# Patient Record
Sex: Female | Born: 1935 | Race: White | Hispanic: No | Smoking: Never smoker
Health system: Southern US, Community
[De-identification: ages and names within clinical notes are randomized; demographics above are authoritative.]

## PROBLEM LIST (undated history)

## (undated) DIAGNOSIS — E119 Type 2 diabetes mellitus without complications: Secondary | ICD-10-CM

## (undated) DIAGNOSIS — M1990 Unspecified osteoarthritis, unspecified site: Secondary | ICD-10-CM

## (undated) DIAGNOSIS — N390 Urinary tract infection, site not specified: Secondary | ICD-10-CM

## (undated) DIAGNOSIS — I10 Essential (primary) hypertension: Secondary | ICD-10-CM

## (undated) HISTORY — PX: CHOLECYSTECTOMY: SHX55

## (undated) HISTORY — PX: HERNIA REPAIR: SHX51

---

## 2016-06-10 DIAGNOSIS — N390 Urinary tract infection, site not specified: Secondary | ICD-10-CM

## 2016-06-10 HISTORY — DX: Urinary tract infection, site not specified: N39.0

## 2016-06-12 ENCOUNTER — Emergency Department (HOSPITAL_COMMUNITY): Payer: Medicare Other

## 2016-06-12 ENCOUNTER — Inpatient Hospital Stay (HOSPITAL_COMMUNITY)
Admission: EM | Admit: 2016-06-12 | Discharge: 2016-07-08 | DRG: 689 | Disposition: E | Payer: Medicare Other | Attending: Internal Medicine | Admitting: Internal Medicine

## 2016-06-12 ENCOUNTER — Inpatient Hospital Stay (HOSPITAL_COMMUNITY): Payer: Medicare Other

## 2016-06-12 ENCOUNTER — Encounter (HOSPITAL_COMMUNITY): Payer: Self-pay | Admitting: Certified Nurse Midwife

## 2016-06-12 DIAGNOSIS — J9602 Acute respiratory failure with hypercapnia: Secondary | ICD-10-CM | POA: Diagnosis not present

## 2016-06-12 DIAGNOSIS — K5641 Fecal impaction: Secondary | ICD-10-CM | POA: Diagnosis present

## 2016-06-12 DIAGNOSIS — Z9049 Acquired absence of other specified parts of digestive tract: Secondary | ICD-10-CM

## 2016-06-12 DIAGNOSIS — R680 Hypothermia, not associated with low environmental temperature: Secondary | ICD-10-CM | POA: Diagnosis present

## 2016-06-12 DIAGNOSIS — K430 Incisional hernia with obstruction, without gangrene: Secondary | ICD-10-CM | POA: Diagnosis present

## 2016-06-12 DIAGNOSIS — Z7189 Other specified counseling: Secondary | ICD-10-CM

## 2016-06-12 DIAGNOSIS — G9340 Encephalopathy, unspecified: Secondary | ICD-10-CM | POA: Diagnosis not present

## 2016-06-12 DIAGNOSIS — N390 Urinary tract infection, site not specified: Principal | ICD-10-CM | POA: Diagnosis present

## 2016-06-12 DIAGNOSIS — Z638 Other specified problems related to primary support group: Secondary | ICD-10-CM | POA: Diagnosis not present

## 2016-06-12 DIAGNOSIS — Z79899 Other long term (current) drug therapy: Secondary | ICD-10-CM | POA: Diagnosis not present

## 2016-06-12 DIAGNOSIS — R4189 Other symptoms and signs involving cognitive functions and awareness: Secondary | ICD-10-CM

## 2016-06-12 DIAGNOSIS — A419 Sepsis, unspecified organism: Secondary | ICD-10-CM | POA: Diagnosis not present

## 2016-06-12 DIAGNOSIS — Z66 Do not resuscitate: Secondary | ICD-10-CM | POA: Diagnosis present

## 2016-06-12 DIAGNOSIS — Z515 Encounter for palliative care: Secondary | ICD-10-CM | POA: Diagnosis not present

## 2016-06-12 DIAGNOSIS — E119 Type 2 diabetes mellitus without complications: Secondary | ICD-10-CM | POA: Diagnosis not present

## 2016-06-12 DIAGNOSIS — M1990 Unspecified osteoarthritis, unspecified site: Secondary | ICD-10-CM | POA: Diagnosis present

## 2016-06-12 DIAGNOSIS — R109 Unspecified abdominal pain: Secondary | ICD-10-CM

## 2016-06-12 DIAGNOSIS — K469 Unspecified abdominal hernia without obstruction or gangrene: Secondary | ICD-10-CM | POA: Diagnosis not present

## 2016-06-12 DIAGNOSIS — E8770 Fluid overload, unspecified: Secondary | ICD-10-CM | POA: Diagnosis not present

## 2016-06-12 HISTORY — DX: Type 2 diabetes mellitus without complications: E11.9

## 2016-06-12 HISTORY — DX: Essential (primary) hypertension: I10

## 2016-06-12 HISTORY — DX: Urinary tract infection, site not specified: N39.0

## 2016-06-12 HISTORY — DX: Unspecified osteoarthritis, unspecified site: M19.90

## 2016-06-12 LAB — CBC WITH DIFFERENTIAL/PLATELET
BASOS ABS: 0 10*3/uL (ref 0.0–0.1)
Basophils Relative: 0 %
EOS ABS: 0 10*3/uL (ref 0.0–0.7)
Eosinophils Relative: 0 %
HEMATOCRIT: 47 % — AB (ref 36.0–46.0)
HEMOGLOBIN: 16.4 g/dL — AB (ref 12.0–15.0)
LYMPHS PCT: 19 %
Lymphs Abs: 1.5 10*3/uL (ref 0.7–4.0)
MCH: 32.5 pg (ref 26.0–34.0)
MCHC: 34.9 g/dL (ref 30.0–36.0)
MCV: 93.1 fL (ref 78.0–100.0)
MONOS PCT: 8 %
Monocytes Absolute: 0.6 10*3/uL (ref 0.1–1.0)
NEUTROS PCT: 73 %
Neutro Abs: 5.7 10*3/uL (ref 1.7–7.7)
Platelets: 221 10*3/uL (ref 150–400)
RBC: 5.05 MIL/uL (ref 3.87–5.11)
RDW: 12.6 % (ref 11.5–15.5)
WBC: 7.8 10*3/uL (ref 4.0–10.5)

## 2016-06-12 LAB — URINALYSIS, ROUTINE W REFLEX MICROSCOPIC
BILIRUBIN URINE: NEGATIVE
GLUCOSE, UA: NEGATIVE mg/dL
Ketones, ur: NEGATIVE mg/dL
NITRITE: NEGATIVE
PH: 5 (ref 5.0–8.0)
Protein, ur: NEGATIVE mg/dL
SPECIFIC GRAVITY, URINE: 1.017 (ref 1.005–1.030)

## 2016-06-12 LAB — COMPREHENSIVE METABOLIC PANEL
ALBUMIN: 3.5 g/dL (ref 3.5–5.0)
ALK PHOS: 50 U/L (ref 38–126)
ALT: UNDETERMINED U/L (ref 14–54)
ANION GAP: 11 (ref 5–15)
AST: 18 U/L (ref 15–41)
BILIRUBIN TOTAL: UNDETERMINED mg/dL (ref 0.3–1.2)
BUN: 15 mg/dL (ref 6–20)
CALCIUM: 9.1 mg/dL (ref 8.9–10.3)
CO2: 34 mmol/L — ABNORMAL HIGH (ref 22–32)
Chloride: 93 mmol/L — ABNORMAL LOW (ref 101–111)
Creatinine, Ser: 0.42 mg/dL — ABNORMAL LOW (ref 0.44–1.00)
GFR calc non Af Amer: >60 mL/min (ref >60)
Glucose, Bld: 173 mg/dL — ABNORMAL HIGH (ref 65–99)
POTASSIUM: 4.3 mmol/L (ref 3.5–5.1)
Sodium: 138 mmol/L (ref 135–145)
TOTAL PROTEIN: 6.5 g/dL (ref 6.5–8.1)

## 2016-06-12 LAB — CBC
HEMATOCRIT: 45 % (ref 36.0–46.0)
Hemoglobin: 14.4 g/dL (ref 12.0–15.0)
MCH: 29.9 pg (ref 26.0–34.0)
MCHC: 32 g/dL (ref 30.0–36.0)
MCV: 93.6 fL (ref 78.0–100.0)
Platelets: 209 10*3/uL (ref 150–400)
RBC: 4.81 MIL/uL (ref 3.87–5.11)
RDW: 12.5 % (ref 11.5–15.5)
WBC: 8 10*3/uL (ref 4.0–10.5)

## 2016-06-12 LAB — GLUCOSE, CAPILLARY: Glucose-Capillary: 146 mg/dL — ABNORMAL HIGH (ref 65–99)

## 2016-06-12 LAB — LACTIC ACID, PLASMA: LACTIC ACID, VENOUS: 0.8 mmol/L (ref 0.5–1.9)

## 2016-06-12 LAB — CREATININE, SERUM
Creatinine, Ser: 0.39 mg/dL — ABNORMAL LOW (ref 0.44–1.00)
Creatinine, Ser: 0.38 mg/dL — ABNORMAL LOW (ref 0.44–1.00)
GFR calc Af Amer: >60 mL/min (ref >60)
GFR calc non Af Amer: >60 mL/min (ref >60)

## 2016-06-12 LAB — TROPONIN I: TROPONIN I: 0.03 ng/mL — AB (ref <0.03)

## 2016-06-12 LAB — I-STAT CG4 LACTIC ACID, ED
LACTIC ACID, VENOUS: 0.72 mmol/L (ref 0.5–1.9)
Lactic Acid, Venous: 1.57 mmol/L (ref 0.5–1.9)

## 2016-06-12 MED ORDER — DEXTROSE 5 % IV SOLN
1.0000 g | INTRAVENOUS | Status: DC
  Administered 2016-06-13 – 2016-06-15 (×3): 1 g via INTRAVENOUS
  Filled 2016-06-12 (×5): qty 10

## 2016-06-12 MED ORDER — IOPAMIDOL (ISOVUE-300) INJECTION 61%
INTRAVENOUS | Status: AC
  Administered 2016-06-12: 100 mL
  Filled 2016-06-12: qty 100

## 2016-06-12 MED ORDER — SODIUM CHLORIDE 0.9 % IV BOLUS (SEPSIS)
1000.0000 mL | Freq: Once | INTRAVENOUS | Status: AC
  Administered 2016-06-12: 1000 mL via INTRAVENOUS

## 2016-06-12 MED ORDER — SODIUM CHLORIDE 0.9% FLUSH
3.0000 mL | Freq: Two times a day (BID) | INTRAVENOUS | Status: DC
  Administered 2016-06-12 – 2016-06-13 (×2): 3 mL via INTRAVENOUS

## 2016-06-12 MED ORDER — HYDRALAZINE HCL 20 MG/ML IJ SOLN: 10.0000 mg | INTRAMUSCULAR | Status: DC | PRN

## 2016-06-12 MED ORDER — INSULIN ASPART 100 UNIT/ML ~~LOC~~ SOLN: 0.0000 [IU] | Freq: Every day | SUBCUTANEOUS | Status: DC

## 2016-06-12 MED ORDER — ACETAMINOPHEN 325 MG PO TABS
650.0000 mg | ORAL_TABLET | Freq: Four times a day (QID) | ORAL | Status: DC | PRN
  Administered 2016-06-14 – 2016-06-15 (×2): 650 mg via ORAL
  Filled 2016-06-12 (×2): qty 2

## 2016-06-12 MED ORDER — LISINOPRIL 5 MG PO TABS
5.0000 mg | ORAL_TABLET | Freq: Every day | ORAL | Status: DC
  Administered 2016-06-12 – 2016-06-15 (×4): 5 mg via ORAL
  Filled 2016-06-12 (×5): qty 1

## 2016-06-12 MED ORDER — ACETAMINOPHEN 650 MG RE SUPP
650.0000 mg | Freq: Four times a day (QID) | RECTAL | Status: DC | PRN
  Filled 2016-06-12: qty 1

## 2016-06-12 MED ORDER — DEXTROSE 5 % IV SOLN: 2.0000 g | Freq: Once | INTRAVENOUS | Status: DC

## 2016-06-12 MED ORDER — INSULIN ASPART 100 UNIT/ML ~~LOC~~ SOLN
0.0000 [IU] | Freq: Three times a day (TID) | SUBCUTANEOUS | Status: DC
  Administered 2016-06-14: 3 [IU] via SUBCUTANEOUS
  Administered 2016-06-14 (×2): 2 [IU] via SUBCUTANEOUS
  Administered 2016-06-15: 3 [IU] via SUBCUTANEOUS
  Administered 2016-06-15: 2 [IU] via SUBCUTANEOUS

## 2016-06-12 MED ORDER — SODIUM CHLORIDE 0.9 % IV SOLN
INTRAVENOUS | Status: DC
  Administered 2016-06-12 – 2016-06-15 (×3): via INTRAVENOUS

## 2016-06-12 MED ORDER — ENOXAPARIN SODIUM 40 MG/0.4ML ~~LOC~~ SOLN
40.0000 mg | SUBCUTANEOUS | Status: DC
  Administered 2016-06-12 – 2016-06-14 (×3): 40 mg via SUBCUTANEOUS
  Filled 2016-06-12 (×3): qty 0.4

## 2016-06-12 MED ORDER — DEXTROSE 5 % IV SOLN
1.0000 g | Freq: Once | INTRAVENOUS | Status: AC
  Administered 2016-06-12: 1 g via INTRAVENOUS
  Filled 2016-06-12: qty 10

## 2016-06-12 MED ORDER — MELATONIN 3 MG PO TABS
3.0000 mg | ORAL_TABLET | Freq: Every evening | ORAL | Status: DC | PRN
  Filled 2016-06-12: qty 1

## 2016-06-12 NOTE — ED Notes (Signed)
Delay in lab draw,   Pt enroute to xray. 

## 2016-06-12 NOTE — ED Notes (Signed)
O2 sat 91% on RA and pt states she is SOB. O2 applied at 2L.

## 2016-06-12 NOTE — ED Provider Notes (Signed)
[facility]-EM[facility]ENCY DEPT Provider Note   CSN: [id number] Arrival date & time: 06/29/2016  1323     History   Chief Complaint Chief Complaint  Patient presents with  . Weakness    HPI Lindsey Garza is a 81 y.o. female.  Pt presents to the ED with generalized weakness.  She has [place] to [place] family's home in [place] back in July.  [facility] is followed [facility].  Pt normally walks with her walker and gets around pretty well.  The pt has not been able to ambulate well for the past 2 days.  The pt's family said she's been urinating a lot.  Pt denies any pain.      Past Medical History:  Diagnosis Date  . Arthritis   . Diabetes mellitus without complication [facility](HCC)     Patient Active Problem List   Diagnosis Date Noted  . Diabetes mellitus (HCC) 07/01/2016  . Arthritis 07/07/2016  . Sepsis secondary to UTI (HCC) 07/04/2016  . Abdominal hernia 07/06/2016  . UTI (urinary tract infection) 07/01/2016    Past Surgical History:  Procedure Laterality Date  . CHOLECYSTECTOMY    . HERNIA REPAIR      OB History    No data available       Home Medications    Prior to Admission medications   Medication Sig Start Date End Date Taking? Authorizing Provider  Melatonin 2.5 MG CAPS Take 2.5 mg by mouth at bedtime.   Yes Historical Provider, MD  sertraline (ZOLOFT) 25 MG tablet Take 25 mg by mouth daily.   Yes Historical Provider, MD    Family History No family history on file.  Social History Social History  Substance Use Topics  . Smoking status: Never Smoker  . Smokeless tobacco: Never Used  . Alcohol use No     Allergies   Patient has no known allergies.   Review of Systems Review of Systems  Neurological: Positive for weakness.  All other systems reviewed and are negative.    Physical Exam Updated Vital Signs BP 127/71 (BP Location: Left Arm)   Pulse 73   Temp 97.7 F (36.5 C) (Oral)   Resp 17   Ht 5\' 2"  (1.575 m) Comment: per  pt  Wt 133 lb 6.1 oz (60.5 kg)   SpO2 96%   BMI 24.40 kg/m   Physical Exam  Constitutional: She is oriented to person, place, and time. She appears well-developed and well-nourished.  HENT:  Head: Normocephalic and atraumatic.  Right Ear: External ear normal.  Left Ear: External ear normal.  Nose: Nose normal.  Mouth/Throat: Oropharynx is clear and moist.  Eyes: Conjunctivae and EOM are normal. Pupils are equal, round, and reactive to light.  Neck: Normal range of motion. Neck supple.  Cardiovascular: Normal rate, regular rhythm, normal heart sounds and intact distal pulses.   Pulmonary/Chest: Effort normal and breath sounds normal.  Abdominal: Soft. Bowel sounds are normal.  Large ventral hernia (chronic)  Musculoskeletal: Normal range of motion.  Neurological: She is alert and oriented to person, place, and time.  Skin:  Bilateral lower extremity chronic skin changes.  Psychiatric: She has a normal mood and affect. Her behavior is normal. Judgment and thought content normal.  Nursing note and vitals reviewed.    ED Treatments / Results  Labs (all labs ordered are listed, but only abnormal results are displayed) Labs Reviewed  COMPREHENSIVE METABOLIC PANEL - Abnormal; Notable for the following:  Result Value   Chloride 93 (*)    CO2 34 (*)    Glucose, Bld 173 (*)    Creatinine, Ser 0.42 (*)    All other components within normal limits  CBC WITH DIFFERENTIAL/PLATELET - Abnormal; Notable for the following:    Hemoglobin 16.4 (*)    HCT 47.0 (*)    All other components within normal limits  TROPONIN I - Abnormal; Notable for the following:    Troponin I 0.03 (*)    All other components within normal limits  URINALYSIS, ROUTINE W REFLEX MICROSCOPIC - Abnormal; Notable for the following:    Color, Urine AMBER (*)    APPearance CLOUDY (*)    Hgb urine dipstick MODERATE (*)    Leukocytes, UA LARGE (*)    Bacteria, UA MANY (*)    Squamous Epithelial / LPF 0-5 (*)      All other components within normal limits  CREATININE, SERUM - Abnormal; Notable for the following:    Creatinine, Ser 0.38 (*)    All other components within normal limits  COMPREHENSIVE METABOLIC PANEL - Abnormal; Notable for the following:    Chloride 95 (*)    CO2 38 (*)    Glucose, Bld 173 (*)    Creatinine, Ser 0.39 (*)    Total Protein 6.2 (*)    Albumin 3.2 (*)    All other components within normal limits  CBC - Abnormal; Notable for the following:    HCT 47.2 (*)    All other components within normal limits  HEMOGLOBIN A1C - Abnormal; Notable for the following:    Hgb A1c MFr Bld 7.9 (*)    All other components within normal limits  CREATININE, SERUM - Abnormal; Notable for the following:    Creatinine, Ser 0.39 (*)    All other components within normal limits  GLUCOSE, CAPILLARY - Abnormal; Notable for the following:    Glucose-Capillary 146 (*)    All other components within normal limits  GLUCOSE, CAPILLARY - Abnormal; Notable for the following:    Glucose-Capillary 129 (*)    All other components within normal limits  GLUCOSE, CAPILLARY - Abnormal; Notable for the following:    Glucose-Capillary 139 (*)    All other components within normal limits  BASIC METABOLIC PANEL - Abnormal; Notable for the following:    Chloride 88 (*)    CO2 39 (*)    Glucose, Bld 149 (*)    Creatinine, Ser 0.37 (*)    Calcium 8.5 (*)    All other components within normal limits  GLUCOSE, CAPILLARY - Abnormal; Notable for the following:    Glucose-Capillary 156 (*)    All other components within normal limits  GLUCOSE, CAPILLARY - Abnormal; Notable for the following:    Glucose-Capillary 174 (*)    All other components within normal limits  URINE CULTURE  CULTURE, BLOOD (ROUTINE X 2)  CULTURE, BLOOD (ROUTINE X 2)  LACTIC ACID, PLASMA  CBC  LACTIC ACID, PLASMA  CBC  I-STAT CG4 LACTIC ACID, ED  I-STAT CG4 LACTIC ACID, ED  CBG MONITORING, ED    EKG  EKG  Interpretation  Date/Time:  Saturday 06/25/2016 13:32:52 EST Ventricular Rate:  70 PR Interval:    QRS Duration: 88 QT Interval:  402 QTC Calculation: 434 R Axis:   26 Text Interpretation:  Sinus rhythm Ventricular premature complex Low voltage, precordial leads Probable anteroseptal infarct, old Confirmed by [facility] MD, Sou Nohr ([id number]) on 06-25-2016 1:35:05 PM  Radiology Dg Chest 2 View  Result Date: 2016-06-30 CLINICAL DATA:  Weakness and SOB X 2 days. Patient family reports patient has a knot on her right side on the lower part of her chest. HX diabetes EXAM: CHEST  2 VIEW COMPARISON:  None. FINDINGS: Heart size is normal. Mildly prominent interstitial marking are present. No focal consolidations or pleural effusions. No pulmonary edema. IMPRESSION: No evidence for acute cardiopulmonary abnormality. Electronically Signed   By: Norva Pavlov M.D.   On: 2016/06/30 15:15   Ct Abdomen Pelvis W Contrast  Result Date: Jun 30, 2016 CLINICAL DATA:  Worsening generalized weakness and abdominal pain. EXAM: CT ABDOMEN AND PELVIS WITH CONTRAST TECHNIQUE: Multidetector CT imaging of the abdomen and pelvis was performed using the standard protocol following bolus administration of intravenous contrast. CONTRAST:  ISOVUE-300 IOPAMIDOL (ISOVUE-300) INJECTION 61% COMPARISON:  None. FINDINGS: Lower chest: Mild airspace opacities in both lung bases could be atelectatic or infectious. No effusions. Hepatobiliary: No focal liver abnormality is seen. Status post cholecystectomy. No biliary dilatation. Pancreas: Unremarkable. No pancreatic ductal dilatation or surrounding inflammatory changes. Spleen: Normal in size without focal abnormality. Adrenals/Urinary Tract: Both adrenals are normal. A few low-attenuation lesions are present about each kidney, measuring up to 3 cm. These likely represent simple cysts. No suspicious renal parenchymal lesion is evident. Collecting systems and ureters are  unremarkable. Urinary bladder is displaced anteriorly by the distended rectum but otherwise unremarkable. Stomach/Bowel: There is marked rectal distention with stool, measuring up to 10 cm transverse. Stomach is unremarkable. Most of the small bowel and colon reside within a very large right lateral abdominal hernia. No evidence of obstruction within the hernia. No extraluminal gas. No focal inflammation of bowel. Vascular/Lymphatic: The abdominal aorta is normal in caliber with mild atherosclerotic calcification. No adenopathy in the abdomen or pelvis. Reproductive: Multiple uterine calcifications.  No adnexal masses. Other: No ascites. Musculoskeletal: No significant skeletal lesion. Moderately severe facet arthritis from L4 through S1. Very large right lateral abdominal hernia. Adjacent smaller anterolateral hernia. IMPRESSION: 1. Most of the small bowel and colon reside within a large right lateral abdominal wall hernia. Smaller adjacent right anterolateral hernia contains a portion of the cecum and ileocecal valve. No obstruction, perforation or acute inflammation of bowel within the hernia. 2. Marked rectal distention with stool, likely fecal impaction. The distended rectum displaces the urinary bladder anteriorly. 3. Mild lung base opacities bilaterally, atelectasis versus infection. 4. Low-attenuation renal lesions, probably benign cysts. 5. Severe lower lumbar facet arthritis. Electronically Signed   By: Ellery Plunk M.D.   On: 06-30-16 20:35    Procedures Procedures (including critical care time)  Medications Ordered in ED Medications  enoxaparin (LOVENOX) injection 40 mg (40 mg Subcutaneous Given 06/13/16 2304)  sodium chloride flush (NS) 0.9 % injection 3 mL (3 mLs Intravenous Not Given 06/13/16 2200)  0.9 %  sodium chloride infusion ( Intravenous New Bag/Given 06/14/16 0048)  acetaminophen (TYLENOL) tablet 650 mg (not administered)    Or  acetaminophen (TYLENOL) suppository 650 mg (not  administered)  insulin aspart (novoLOG) injection 0-15 Units ( Subcutaneous Not Given 06/13/16 1855)  insulin aspart (novoLOG) injection 0-5 Units (0 Units Subcutaneous Not Given 06/13/16 2200)  lisinopril (PRINIVIL,ZESTRIL) tablet 5 mg (5 mg Oral Given 06/13/16 1202)  hydrALAZINE (APRESOLINE) injection 10 mg (not administered)  cefTRIAXone (ROCEPHIN) 1 g in dextrose 5 % 50 mL IVPB (1 g Intravenous Given 06/13/16 1855)  Melatonin TABS 3 mg (not administered)  sertraline (ZOLOFT) tablet 25 mg (25 mg Oral  Given 06/13/16 1202)  morphine 2 MG/ML injection 0.5 mg (not administered)  sodium chloride 0.9 % bolus 1,000 mL (0 mLs Intravenous Stopped 2016/07/01 1620)  cefTRIAXone (ROCEPHIN) 1 g in dextrose 5 % 50 mL IVPB (0 g Intravenous Stopped 07-01-16 2001)  iopamidol (ISOVUE-300) 61 % injection (100 mLs  Contrast Given 2016/07/01 1941)  magnesium citrate solution 1 Bottle (1 Bottle Oral Given 06/13/16 0837)  polyethylene glycol (MIRALAX / GLYCOLAX) packet 17 g (17 g Oral Given 06/13/16 0837)  sorbitol, milk of mag, mineral oil, glycerin (SMOG) enema (960 mLs Rectal Given 06/13/16 1338)     Initial Impression / Assessment and Plan / ED Course  I have reviewed the triage vital signs and the nursing notes.  Pertinent labs & imaging results that were available during my care of the patient were reviewed by me and considered in my medical decision making (see chart for details).    Labs are pending at shift change.  Pt to be signed out to Dr. Madilyn Hook.  Final Clinical Impressions(s) / ED Diagnoses   Final diagnoses:  Abdominal hernia  Abdominal hernia without obstruction and without gangrene, recurrence not specified, unspecified hernia type    New Prescriptions Current Discharge Medication List       Jacalyn Lefevre, MD 06/14/16 [phone]

## 2016-06-12 NOTE — ED Triage Notes (Signed)
Pt arrives [facility] for generalized weakness. There family states that pt is usually able to ambulate with walker but has been unable to do that for last 2 days. Family at the bedside.

## 2016-06-12 NOTE — ED Provider Notes (Signed)
Patient visit shared. Patient here with increased weakness.  She is weak, confused and intermittently tachypneic on exam.  UA c/w UTI, treating with abx.  Hospitalist consulted for admission for further treatment.  Patient and family updated of findings and in agreement with admission.     Tilden FossaElizabeth Erielle Gawronski, MD 06/13/16 [phone]

## 2016-06-12 NOTE — H&P (Signed)
History and Physical    Lindsey Garza RUE:[id number] DOB: [birth year] DOA: June 24, 2016  PCP: PROVIDER NOT IN SYSTEM  Patient coming from: Home  Chief Complaint: Weakness  HPI: Lindsey Garza is a 81 y.o. female with medical history significant of DM, arthritis, abd hernia who presents with worsening generalized weakness and polyuria over the past  2-3 days prior to hospital admission. Patient lives with her son, who later brought pt to the ED as weakness continued to worsen. Patient does report some generalized abd pain. Reports some sob. No coughing, but does report some runny nose. Patient reports last UTI was "many years ago." Is managing DM through diet primarily. Patient also reports soft mass over RUQ that has been present over the past several months. Initially denied tenderness, however pt noted to grimace on palpation over lesion in ED.  ED Course: In the ED, pt was found to have an axillary temp of 63F, tachypenea, and UA suggestive of UTI. Urine cx was ordered. CXR was unremarkable. Patient was started on empiric rocephin and hospitalist service consulted for consideration for admission.  Review of Systems:  Review of Systems  Constitutional: Positive for malaise/fatigue.  HENT: Negative for ear discharge, ear pain and nosebleeds.   Eyes: Negative for double vision and photophobia.  Respiratory: Positive for shortness of breath. Negative for cough, hemoptysis, wheezing and stridor.   Cardiovascular: Negative for orthopnea and claudication.  Gastrointestinal: Negative for blood in stool, diarrhea and heartburn.  Genitourinary: Positive for frequency. Negative for hematuria.  Musculoskeletal: Positive for falls and joint pain. Negative for myalgias.  Neurological: Positive for weakness. Negative for tingling, tremors and loss of consciousness.  Psychiatric/Behavioral: Negative for hallucinations and memory loss. The patient is not nervous/anxious.     Past Medical History:    Diagnosis Date  . Arthritis   . Diabetes mellitus without complication [facility])     Past Surgical History:  Procedure Laterality Date  . CHOLECYSTECTOMY    . HERNIA REPAIR       reports that she has never smoked. She has never used smokeless tobacco. She reports that she does not drink alcohol. Her drug history is not on file.  No Known Allergies  No family history on file.  Prior to Admission medications   Medication Sig Start Date End Date Taking? Authorizing Provider  Melatonin 2.5 MG CAPS Take 2.5 mg by mouth at bedtime.   Yes Historical Provider, MD  sertraline (ZOLOFT) 25 MG tablet Take 25 mg by mouth daily.   Yes Historical Provider, MD    Physical Exam: Vitals:   Jun 24, 2016 1700 06-24-2016 1715 06-24-2016 1730 06/24/2016 1745  BP: 157/96 176/93 (!) 149/103 155/100  Pulse: 80 75 64 (!) 28  Resp: 17 23 17  (!) 27  Temp:      TempSrc:      SpO2: 92% 92% 100% 93%  Weight:      Height:        Constitutional: NAD, calm, comfortable Vitals:   06-24-2016 1700 24-Jun-2016 1715 06/24/16 1730 06-24-2016 1745  BP: 157/96 176/93 (!) 149/103 155/100  Pulse: 80 75 64 (!) 28  Resp: 17 23 17  (!) 27  Temp:      TempSrc:      SpO2: 92% 92% 100% 93%  Weight:      Height:       Eyes: PERRL, lids and conjunctivae normal ENMT: Mucous membranes are moist. Posterior pharynx clear of any exudate or lesions.Normal dentition.  Neck: normal, supple, no masses, no thyromegaly Respiratory:  clear to auscultation bilaterally, no wheezing, no crackles. Normal respiratory effort. No accessory muscle use.  Cardiovascular: Regular rate and rhythm, no murmurs / rubs / gallops. No extremity edema. 2+ pedal pulses. No carotid bruits.  Abdomen: pos BS, R lower quadrant abd hernia, soft palpable mass over RUQ with grimacing on palpation Musculoskeletal: no clubbing / cyanosis. No joint deformity upper and lower extremities. Good ROM, no contractures. Normal muscle tone.  Skin: no rashes, lesions, ulcers. No  induration Neurologic: CN 2-12 grossly intact. Sensation intact, DTR normal. Strength 5/5 in all 4.  Psychiatric: Normal judgment and insight. Alert and oriented x 3. Normal mood.    Labs on Admission: I have personally reviewed following labs and imaging studies  CBC:  Recent Labs Lab Jun 05, 2016 1623  WBC 7.8  NEUTROABS 5.7  HGB 16.4*  HCT 47.0*  MCV 93.1  PLT 221   Basic Metabolic Panel:  Recent Labs Lab Jun 05, 2016 1623  NA 138  K 4.3  CL 93*  CO2 34*  GLUCOSE 173*  BUN 15  CREATININE 0.42*  CALCIUM 9.1   GFR: Estimated Creatinine Clearance: 50 mL/min (by C-G formula based on SCr of 0.42 mg/dL (L)). Liver Function Tests:  Recent Labs Lab Jun 05, 2016 1623  AST 18  ALT QUANTITY NOT SUFFICIENT, UNABLE TO PERFORM TEST  ALKPHOS 50  BILITOT QUANTITY NOT SUFFICIENT, UNABLE TO PERFORM TEST  PROT 6.5  ALBUMIN 3.5   No results for input(s): LIPASE, AMYLASE in the last 168 hours. No results for input(s): AMMONIA in the last 168 hours. Coagulation Profile: No results for input(s): INR, PROTIME in the last 168 hours. Cardiac Enzymes:  Recent Labs Lab Jun 05, 2016 1623  TROPONINI 0.03*   BNP (last 3 results) No results for input(s): PROBNP in the last 8760 hours. HbA1C: No results for input(s): HGBA1C in the last 72 hours. CBG: No results for input(s): GLUCAP in the last 168 hours. Lipid Profile: No results for input(s): CHOL, HDL, LDLCALC, TRIG, CHOLHDL, LDLDIRECT in the last 72 hours. Thyroid Function Tests: No results for input(s): TSH, T4TOTAL, FREET4, T3FREE, THYROIDAB in the last 72 hours. Anemia Panel: No results for input(s): VITAMINB12, FOLATE, FERRITIN, TIBC, IRON, RETICCTPCT in the last 72 hours. Urine analysis:    Component Value Date/Time   COLORURINE AMBER (A) 2017/04/10 1549   APPEARANCEUR CLOUDY (A) 2017/04/10 1549   LABSPEC 1.017 2017/04/10 1549   PHURINE 5.0 2017/04/10 1549   GLUCOSEU NEGATIVE 2017/04/10 1549   HGBUR MODERATE (A) 2017/04/10  1549   BILIRUBINUR NEGATIVE 2017/04/10 1549   KETONESUR NEGATIVE 2017/04/10 1549   PROTEINUR NEGATIVE 2017/04/10 1549   NITRITE NEGATIVE 2017/04/10 1549   LEUKOCYTESUR LARGE (A) 2017/04/10 1549   Sepsis Labs: !!!!!!!!!!!!!!!!!!!!!!!!!!!!!!!!!!!!!!!!!!!! @LABRCNTIP (procalcitonin:4,lacticidven:4) )No results found for this or any previous visit (from the past 240 hour(s)).   Radiological Exams on Admission: Dg Chest 2 View  Result Date: 06/13/2016 CLINICAL DATA:  Weakness and SOB X 2 days. Patient family reports patient has a knot on her right side on the lower part of her chest. HX diabetes EXAM: CHEST  2 VIEW COMPARISON:  None. FINDINGS: Heart size is normal. Mildly prominent interstitial marking are present. No focal consolidations or pleural effusions. No pulmonary edema. IMPRESSION: No evidence for acute cardiopulmonary abnormality. Electronically Signed   By: Norva PavlovElizabeth  Brown M.D.   On: 2017/04/10 15:15    Assessment/Plan Principal Problem:   Sepsis secondary to UTI [facility](HCC) Active Problems:   Diabetes mellitus (HCC)   Arthritis   Abdominal hernia   UTI (urinary  tract infection)   1. UTI with possible sepsis present on admission 1. Hypothermia, tachypnea with evidence of UTI on presentation 2. UA suggestive of UTI 3. Urine cx pending 4. Will order blood cx 5. Agree with rocephin empirically. Will continue 6. Cont on basal IVF at 75cc/hr 7. Lactate <2 at presentation. Will follow trends 8. Admit to med-tele, inpt 2. Diabetes mellitus 1. Will continue on SSI coverage 2. Random glucose of 173 3. Will check a1c 3. Arthritis 1. Appear stable at this time 2. Will request PT/OT 4. Abdominal hernia 1. No signs of incarceration of lower quadrant hernia 2. Palpable RUQ soft mass noted on exam with grimacing on palpation 3. Family reports no recent abdominal imaging 4. Will request CT abd/pelvis to r/o acute process  DVT prophylaxis: Lovenox subQ  Code Status: Full Family  Communication: Pt in room  Disposition Plan: Uncertain at this time  Consults called:  Admission status: Inpt as patient will likely need more than 2 midnight stay to medically treat sepsis with UTI   Lummie Montijo, Scheryl Marten MD [facility] Pager [phone]  If 7PM-7AM, please contact night-coverage [email or web address] Password [facility]  06/22/2016, 6:39 PM

## 2016-06-12 NOTE — Progress Notes (Signed)
Pharmacy Antibiotic Note  Lindsey Garza is a 81 y.o. female admitted on 06/25/2016 with sepsis 2/2 to UTI.  Pharmacy has been consulted for Ceftriaxone dosing for UTI  Plan: Ceftriaxone 1gm IV q24h  Pharmacy will sign off. No adjustments needed if renal function changes.   Height: 5\' 4"  (162.6 cm) Weight: 140 lb (63.5 kg) IBW/kg (Calculated) : 54.7  Temp (24hrs), Avg:95.8 F (35.4 C), Min:95.8 F (35.4 C), Max:95.8 F (35.4 C)   Recent Labs Lab 06/26/2016 1616 06/25/2016 1623 06/25/2016 1746  WBC  --  7.8  --   CREATININE  --  0.42* 0.39*  LATICACIDVEN 1.57  --   --     Estimated Creatinine Clearance: 50 mL/min (by C-G formula based on SCr of 0.39 mg/dL (L)).    No Known Allergies   Thank you for allowing pharmacy to be a part of this patient's care.  Lindsey Garza, RPh Clinical Pharmacist Pager: [phone] 06/10/2016 7:14 PM

## 2016-06-12 NOTE — ED Notes (Signed)
Attempted to call report x 1  

## 2016-06-13 DIAGNOSIS — N390 Urinary tract infection, site not specified: Principal | ICD-10-CM

## 2016-06-13 DIAGNOSIS — A419 Sepsis, unspecified organism: Secondary | ICD-10-CM

## 2016-06-13 DIAGNOSIS — E119 Type 2 diabetes mellitus without complications: Secondary | ICD-10-CM

## 2016-06-13 DIAGNOSIS — M1990 Unspecified osteoarthritis, unspecified site: Secondary | ICD-10-CM

## 2016-06-13 DIAGNOSIS — K469 Unspecified abdominal hernia without obstruction or gangrene: Secondary | ICD-10-CM

## 2016-06-13 LAB — CBC
HCT: 47.2 % — ABNORMAL HIGH (ref 36.0–46.0)
Hemoglobin: 14.9 g/dL (ref 12.0–15.0)
MCH: 30.1 pg (ref 26.0–34.0)
MCHC: 31.6 g/dL (ref 30.0–36.0)
MCV: 95.4 fL (ref 78.0–100.0)
PLATELETS: 183 10*3/uL (ref 150–400)
RBC: 4.95 MIL/uL (ref 3.87–5.11)
RDW: 12.6 % (ref 11.5–15.5)
WBC: 9 10*3/uL (ref 4.0–10.5)

## 2016-06-13 LAB — LACTIC ACID, PLASMA: LACTIC ACID, VENOUS: 1.1 mmol/L (ref 0.5–1.9)

## 2016-06-13 LAB — COMPREHENSIVE METABOLIC PANEL
ALK PHOS: 48 U/L (ref 38–126)
ALT: 15 U/L (ref 14–54)
AST: 17 U/L (ref 15–41)
Albumin: 3.2 g/dL — ABNORMAL LOW (ref 3.5–5.0)
Anion gap: 6 (ref 5–15)
BUN: 12 mg/dL (ref 6–20)
CHLORIDE: 95 mmol/L — AB (ref 101–111)
CO2: 38 mmol/L — AB (ref 22–32)
CREATININE: 0.39 mg/dL — AB (ref 0.44–1.00)
Calcium: 8.9 mg/dL (ref 8.9–10.3)
GFR calc Af Amer: >60 mL/min (ref >60)
GFR calc non Af Amer: >60 mL/min (ref >60)
GLUCOSE: 173 mg/dL — AB (ref 65–99)
Potassium: 4 mmol/L (ref 3.5–5.1)
SODIUM: 139 mmol/L (ref 135–145)
Total Bilirubin: 0.4 mg/dL (ref 0.3–1.2)
Total Protein: 6.2 g/dL — ABNORMAL LOW (ref 6.5–8.1)

## 2016-06-13 LAB — GLUCOSE, CAPILLARY
GLUCOSE-CAPILLARY: 129 mg/dL — AB (ref 65–99)
GLUCOSE-CAPILLARY: 174 mg/dL — AB (ref 65–99)
Glucose-Capillary: 139 mg/dL — ABNORMAL HIGH (ref 65–99)
Glucose-Capillary: 156 mg/dL — ABNORMAL HIGH (ref 65–99)

## 2016-06-13 LAB — HEMOGLOBIN A1C
Hgb A1c MFr Bld: 7.9 % — ABNORMAL HIGH (ref 4.8–5.6)
Mean Plasma Glucose: 180 mg/dL

## 2016-06-13 MED ORDER — SENNOSIDES-DOCUSATE SODIUM 8.6-50 MG PO TABS: 1.0000 | ORAL_TABLET | Freq: Two times a day (BID) | ORAL | Status: DC

## 2016-06-13 MED ORDER — SERTRALINE HCL 25 MG PO TABS
25.0000 mg | ORAL_TABLET | Freq: Every day | ORAL | Status: DC
  Administered 2016-06-13 – 2016-06-15 (×3): 25 mg via ORAL
  Filled 2016-06-13 (×4): qty 1

## 2016-06-13 MED ORDER — MAGNESIUM CITRATE PO SOLN
1.0000 | Freq: Once | ORAL | Status: AC
  Administered 2016-06-13: 1 via ORAL
  Filled 2016-06-13: qty 296

## 2016-06-13 MED ORDER — SORBITOL 70 % SOLN
960.0000 mL | TOPICAL_OIL | Freq: Once | ORAL | Status: AC
  Administered 2016-06-13: 960 mL via RECTAL
  Filled 2016-06-13: qty 240

## 2016-06-13 MED ORDER — POLYETHYLENE GLYCOL 3350 17 G PO PACK
17.0000 g | PACK | Freq: Once | ORAL | Status: AC
  Administered 2016-06-13: 17 g via ORAL
  Filled 2016-06-13: qty 1

## 2016-06-13 MED ORDER — MORPHINE SULFATE (PF) 2 MG/ML IV SOLN: 0.5000 mg | INTRAVENOUS | Status: DC | PRN

## 2016-06-13 NOTE — Consult Note (Signed)
Reason for Consult:hernia Referring Physician: Dr. Delfino Lovett is an 81 y.o. female.  HPI: This is a 81 year old female who I have been asked to evaluate for incisional hernia. The patient was actually admitted with generalized weakness and polyuria and found to have a UTI with possible sepsis. She had undergone a CT scan of her abdomen and pelvis showing a very large hernia. She has no obstructive symptoms. The patient had a previous open cholecystectomy many years ago and then developed a large ventral hernia. She reports it was repaired in [place] at least 15 years ago.  She reports having had the recurrent hernia for many years. She has no pain from the hernia. She has been slightly constipated.  Past Medical History:  Diagnosis Date  . Arthritis   . Diabetes mellitus without complication [facility])     Past Surgical History:  Procedure Laterality Date  . CHOLECYSTECTOMY    . HERNIA REPAIR      No family history on file.  Social History:  reports that she has never smoked. She has never used smokeless tobacco. She reports that she does not drink alcohol. Her drug history is not on file.  Allergies: No Known Allergies  Medications: I have reviewed the patient's current medications.  Results for orders placed or performed during the hospital encounter of 06/11/2016 (from the past 48 hour(s))  Urinalysis, Routine w reflex microscopic     Status: Abnormal   Collection Time: 06/24/2016  3:49 PM  Result Value Ref Range   Color, Urine AMBER (A) YELLOW    Comment: BIOCHEMICALS MAY BE AFFECTED BY COLOR   APPearance CLOUDY (A) CLEAR   Specific Gravity, Urine 1.017 1.005 - 1.030   pH 5.0 5.0 - 8.0   Glucose, UA NEGATIVE NEGATIVE mg/dL   Hgb urine dipstick MODERATE (A) NEGATIVE   Bilirubin Urine NEGATIVE NEGATIVE   Ketones, ur NEGATIVE NEGATIVE mg/dL   Protein, ur NEGATIVE NEGATIVE mg/dL   Nitrite NEGATIVE NEGATIVE   Leukocytes, UA LARGE (A) NEGATIVE   RBC / HPF 6-30 0 - 5  RBC/hpf   WBC, UA TOO NUMEROUS TO COUNT 0 - 5 WBC/hpf   Bacteria, UA MANY (A) NONE SEEN   Squamous Epithelial / LPF 0-5 (A) NONE SEEN   WBC Clumps PRESENT    Mucous PRESENT   I-Stat CG4 Lactic Acid, ED     Status: None   Collection Time: 06/11/2016  4:16 PM  Result Value Ref Range   Lactic Acid, Venous 1.57 0.5 - 1.9 mmol/L  Comprehensive metabolic panel     Status: Abnormal   Collection Time: 07/04/2016  4:23 PM  Result Value Ref Range   Sodium 138 135 - 145 mmol/L   Potassium 4.3 3.5 - 5.1 mmol/L   Chloride 93 (L) 101 - 111 mmol/L   CO2 34 (H) 22 - 32 mmol/L   Glucose, Bld 173 (H) 65 - 99 mg/dL   BUN 15 6 - 20 mg/dL   Creatinine, Ser 0.42 (L) 0.44 - 1.00 mg/dL   Calcium 9.1 8.9 - 10.3 mg/dL   Total Protein 6.5 6.5 - 8.1 g/dL   Albumin 3.5 3.5 - 5.0 g/dL   AST 18 15 - 41 U/L   ALT QUANTITY NOT SUFFICIENT, UNABLE TO PERFORM TEST 14 - 54 U/L    Comment: CALLED TO AMANDA JONES RN AT 1710 06/19/2016 BY WOOLLENK   Alkaline Phosphatase 50 38 - 126 U/L   Total Bilirubin QUANTITY NOT SUFFICIENT, UNABLE TO PERFORM TEST  0.3 - 1.2 mg/dL    Comment: CALLED TO AMANDA JONES RN AT 3546 06/18/2016 BY WOOLLENK   GFR calc non Af Amer >60 >60 mL/min   GFR calc Af Amer >60 >60 mL/min    Comment: (NOTE) The eGFR has been calculated using the CKD EPI equation. This calculation has not been validated in all clinical situations. eGFR's persistently <60 mL/min signify possible Chronic Kidney Disease.    Anion gap 11 5 - 15  CBC with Differential     Status: Abnormal   Collection Time: 06/15/2016  4:23 PM  Result Value Ref Range   WBC 7.8 4.0 - 10.5 K/uL    Comment: WHITE COUNT CONFIRMED ON SMEAR   RBC 5.05 3.87 - 5.11 MIL/uL   Hemoglobin 16.4 (H) 12.0 - 15.0 g/dL   HCT 47.0 (H) 36.0 - 46.0 %   MCV 93.1 78.0 - 100.0 fL   MCH 32.5 26.0 - 34.0 pg   MCHC 34.9 30.0 - 36.0 g/dL   RDW 12.6 11.5 - 15.5 %   Platelets 221 150 - 400 K/uL    Comment: PLATELET COUNT CONFIRMED BY SMEAR   Neutrophils Relative %  73 %   Lymphocytes Relative 19 %   Monocytes Relative 8 %   Eosinophils Relative 0 %   Basophils Relative 0 %   Neutro Abs 5.7 1.7 - 7.7 K/uL   Lymphs Abs 1.5 0.7 - 4.0 K/uL   Monocytes Absolute 0.6 0.1 - 1.0 K/uL   Eosinophils Absolute 0.0 0.0 - 0.7 K/uL   Basophils Absolute 0.0 0.0 - 0.1 K/uL   Smear Review MORPHOLOGY UNREMARKABLE   Troponin I     Status: Abnormal   Collection Time: 07/02/2016  4:23 PM  Result Value Ref Range   Troponin I 0.03 (HH) <0.03 ng/mL    Comment: CRITICAL RESULT CALLED TO, READ BACK BY AND VERIFIED WITH: Gavin Potters RN AT 5681 06/28/2016 BY WOOLLENK   Creatinine, serum     Status: Abnormal   Collection Time: 06/28/2016  5:46 PM  Result Value Ref Range   Creatinine, Ser 0.39 (L) 0.44 - 1.00 mg/dL   GFR calc non Af Amer >60 >60 mL/min   GFR calc Af Amer >60 >60 mL/min    Comment: (NOTE) The eGFR has been calculated using the CKD EPI equation. This calculation has not been validated in all clinical situations. eGFR's persistently <60 mL/min signify possible Chronic Kidney Disease.   CBC     Status: None   Collection Time: 06/30/2016  7:07 PM  Result Value Ref Range   WBC 8.0 4.0 - 10.5 K/uL   RBC 4.81 3.87 - 5.11 MIL/uL   Hemoglobin 14.4 12.0 - 15.0 g/dL   HCT 45.0 36.0 - 46.0 %   MCV 93.6 78.0 - 100.0 fL   MCH 29.9 26.0 - 34.0 pg   MCHC 32.0 30.0 - 36.0 g/dL   RDW 12.5 11.5 - 15.5 %   Platelets 209 150 - 400 K/uL  Creatinine, serum     Status: Abnormal   Collection Time: 06/21/2016  7:07 PM  Result Value Ref Range   Creatinine, Ser 0.38 (L) 0.44 - 1.00 mg/dL   GFR calc non Af Amer >60 >60 mL/min   GFR calc Af Amer >60 >60 mL/min    Comment: (NOTE) The eGFR has been calculated using the CKD EPI equation. This calculation has not been validated in all clinical situations. eGFR's persistently <60 mL/min signify possible Chronic Kidney Disease.   Hemoglobin A1c  Status: Abnormal   Collection Time: 07/07/2016  7:07 PM  Result Value Ref Range   Hgb  A1c MFr Bld 7.9 (H) 4.8 - 5.6 %    Comment: (NOTE)         Pre-diabetes: 5.7 - 6.4         Diabetes: >6.4         Glycemic control for adults with diabetes: <7.0    Mean Plasma Glucose 180 mg/dL    Comment: (NOTE) Performed At: [facility] [place], [place] [id number] Lindon Romp MD FY:[id number]   Culture, blood (x 2)     Status: None (Preliminary result)   Collection Time: 06/20/2016  7:09 PM  Result Value Ref Range   Specimen Description BLOOD RIGHT HAND    Special Requests IN PEDIATRIC BOTTLE 2CC    Culture NO GROWTH < 24 HOURS    Report Status PENDING   Culture, blood (x 2)     Status: None (Preliminary result)   Collection Time: 06/20/2016  7:20 PM  Result Value Ref Range   Specimen Description BLOOD LEFT HAND    Special Requests BOTTLES DRAWN AEROBIC ONLY 5CC    Culture NO GROWTH < 24 HOURS    Report Status PENDING   I-Stat CG4 Lactic Acid, ED     Status: None   Collection Time: 06/15/2016  7:25 PM  Result Value Ref Range   Lactic Acid, Venous 0.72 0.5 - 1.9 mmol/L  Lactic acid, plasma     Status: None   Collection Time: 06/11/2016 10:30 PM  Result Value Ref Range   Lactic Acid, Venous 0.8 0.5 - 1.9 mmol/L  Glucose, capillary     Status: Abnormal   Collection Time: 06/30/2016 11:40 PM  Result Value Ref Range   Glucose-Capillary 146 (H) 65 - 99 mg/dL  Lactic acid, plasma     Status: None   Collection Time: 06/13/16  2:39 AM  Result Value Ref Range   Lactic Acid, Venous 1.1 0.5 - 1.9 mmol/L  Comprehensive metabolic panel     Status: Abnormal   Collection Time: 06/13/16  2:40 AM  Result Value Ref Range   Sodium 139 135 - 145 mmol/L   Potassium 4.0 3.5 - 5.1 mmol/L   Chloride 95 (L) 101 - 111 mmol/L   CO2 38 (H) 22 - 32 mmol/L   Glucose, Bld 173 (H) 65 - 99 mg/dL   BUN 12 6 - 20 mg/dL   Creatinine, Ser 0.39 (L) 0.44 - 1.00 mg/dL   Calcium 8.9 8.9 - 10.3 mg/dL   Total Protein 6.2 (L) 6.5 - 8.1 g/dL   Albumin 3.2 (L) 3.5 - 5.0 g/dL   AST 17 15  - 41 U/L   ALT 15 14 - 54 U/L   Alkaline Phosphatase 48 38 - 126 U/L   Total Bilirubin 0.4 0.3 - 1.2 mg/dL   GFR calc non Af Amer >60 >60 mL/min   GFR calc Af Amer >60 >60 mL/min    Comment: (NOTE) The eGFR has been calculated using the CKD EPI equation. This calculation has not been validated in all clinical situations. eGFR's persistently <60 mL/min signify possible Chronic Kidney Disease.    Anion gap 6 5 - 15  CBC     Status: Abnormal   Collection Time: 06/13/16  2:40 AM  Result Value Ref Range   WBC 9.0 4.0 - 10.5 K/uL   RBC 4.95 3.87 - 5.11 MIL/uL   Hemoglobin 14.9 12.0 - 15.0  g/dL   HCT 47.2 (H) 36.0 - 46.0 %   MCV 95.4 78.0 - 100.0 fL   MCH 30.1 26.0 - 34.0 pg   MCHC 31.6 30.0 - 36.0 g/dL   RDW 12.6 11.5 - 15.5 %   Platelets 183 150 - 400 K/uL  Glucose, capillary     Status: Abnormal   Collection Time: 06/13/16  7:54 AM  Result Value Ref Range   Glucose-Capillary 129 (H) 65 - 99 mg/dL  Glucose, capillary     Status: Abnormal   Collection Time: 06/13/16 12:24 PM  Result Value Ref Range   Glucose-Capillary 139 (H) 65 - 99 mg/dL    Dg Chest 2 View  Result Date: 06/25/2016 CLINICAL DATA:  Weakness and SOB X 2 days. Patient family reports patient has a knot on her right side on the lower part of her chest. HX diabetes EXAM: CHEST  2 VIEW COMPARISON:  None. FINDINGS: Heart size is normal. Mildly prominent interstitial marking are present. No focal consolidations or pleural effusions. No pulmonary edema. IMPRESSION: No evidence for acute cardiopulmonary abnormality. Electronically Signed   By: Nolon Nations M.D.   On: 06/27/2016 15:15   Ct Abdomen Pelvis W Contrast  Result Date: 06/10/2016 CLINICAL DATA:  Worsening generalized weakness and abdominal pain. EXAM: CT ABDOMEN AND PELVIS WITH CONTRAST TECHNIQUE: Multidetector CT imaging of the abdomen and pelvis was performed using the standard protocol following bolus administration of intravenous contrast. CONTRAST:  187m  ISOVUE-300 IOPAMIDOL (ISOVUE-300) INJECTION 61% COMPARISON:  None. FINDINGS: Lower chest: Mild airspace opacities in both lung bases could be atelectatic or infectious. No effusions. Hepatobiliary: No focal liver abnormality is seen. Status post cholecystectomy. No biliary dilatation. Pancreas: Unremarkable. No pancreatic ductal dilatation or surrounding inflammatory changes. Spleen: Normal in size without focal abnormality. Adrenals/Urinary Tract: Both adrenals are normal. A few low-attenuation lesions are present about each kidney, measuring up to 3 cm. These likely represent simple cysts. No suspicious renal parenchymal lesion is evident. Collecting systems and ureters are unremarkable. Urinary bladder is displaced anteriorly by the distended rectum but otherwise unremarkable. Stomach/Bowel: There is marked rectal distention with stool, measuring up to 10 cm transverse. Stomach is unremarkable. Most of the small bowel and colon reside within a very large right lateral abdominal hernia. No evidence of obstruction within the hernia. No extraluminal gas. No focal inflammation of bowel. Vascular/Lymphatic: The abdominal aorta is normal in caliber with mild atherosclerotic calcification. No adenopathy in the abdomen or pelvis. Reproductive: Multiple uterine calcifications.  No adnexal masses. Other: No ascites. Musculoskeletal: No significant skeletal lesion. Moderately severe facet arthritis from L4 through S1. Very large right lateral abdominal hernia. Adjacent smaller anterolateral hernia. IMPRESSION: 1. Most of the small bowel and colon reside within a large right lateral abdominal wall hernia. Smaller adjacent right anterolateral hernia contains a portion of the cecum and ileocecal valve. No obstruction, perforation or acute inflammation of bowel within the hernia. 2. Marked rectal distention with stool, likely fecal impaction. The distended rectum displaces the urinary bladder anteriorly. 3. Mild lung base  opacities bilaterally, atelectasis versus infection. 4. Low-attenuation renal lesions, probably benign cysts. 5. Severe lower lumbar facet arthritis. Electronically Signed   By: DAndreas NewportM.D.   On: 06/26/2016 20:35    Review of Systems  Constitutional: Positive for malaise/fatigue.  Respiratory: Negative for shortness of breath.   Cardiovascular: Negative for chest pain.  Gastrointestinal: Positive for constipation. Negative for abdominal pain, diarrhea, nausea and vomiting.  Genitourinary: Positive for frequency.  Neurological:  Positive for weakness.  All other systems reviewed and are negative.  Blood pressure (!) 144/66, pulse 69, temperature 97.3 F (36.3 C), temperature source Oral, resp. rate 17, height 5' 2"  (1.575 m), weight 60.5 kg (133 lb 6.1 oz), SpO2 (!) 84 %. Physical Exam  Constitutional: She is oriented to person, place, and time. She appears well-developed and well-nourished. No distress.  HENT:  Head: Normocephalic and atraumatic.  Right Ear: External ear normal.  Left Ear: External ear normal.  Nose: Nose normal.  Mouth/Throat: Oropharynx is clear and moist. No oropharyngeal exudate.  Eyes: Conjunctivae are normal. Pupils are equal, round, and reactive to light. Right eye exhibits no discharge. Left eye exhibits no discharge. No scleral icterus.  Neck: Normal range of motion. No tracheal deviation present.  Cardiovascular: Normal rate, regular rhythm, normal heart sounds and intact distal pulses.   No murmur heard. Respiratory: Effort normal and breath sounds normal. No respiratory distress. She has no wheezes.  GI: Soft. She exhibits no distension. There is no tenderness. There is no guarding.  She has a well-healed midline incision. There is a very large, chronically incarcerated incisional hernia involving most of the right side of her abdomen. The hernia and the rest of her abdomen are nontender  Musculoskeletal: Normal range of motion. She exhibits no  edema or tenderness.  Lymphadenopathy:    She has no cervical adenopathy.  Neurological: She is alert and oriented to person, place, and time.  Skin: Skin is warm and dry. She is not diaphoretic. No erythema.  Psychiatric: Her behavior is normal. Judgment normal.    Assessment/Plan: Chronically incarcerated incisional hernia  I have reviewed the CT scan of abdomen and pelvis. There is no sign of bowel obstruction. All most her entire small and large intestines are involved in her hernia. She has basically lost the right of domain of her abdominal cavity. She also appears obstipated. From a surgical standpoint, this would be a very difficult hernia to repair. There is no urgent need for repair at this point. If she does indeed want repair in the future, it would need to be done at a tertiary care center by hernia specialists more than likely given the size.  Alysson Geist A 06/13/2016, 2:32 PM

## 2016-06-13 NOTE — Progress Notes (Signed)
[facility]                                                                              Patient Demographics  Lindsey Garza, is a 81 y.o. female, DOB - 19-Aug-1935, Sophia date - 06/27/2016   Admitting Physician Donne Hazel, MD  Outpatient Primary MD for the patient is PROVIDER NOT IN SYSTEM  Outpatient specialists:   LOS - 1  days    Chief Complaint  Patient presents with  . Weakness       Brief summary   Patient is a 81 year old female with diabetes, arthritis, abdominal hernia presented with worsening generalized weakness, polyuria over the last 2-3 days. Patient also reported generalized abdominal pain, shortness of breath. She also reported soft mass over the right upper quadrant. Initially denied a tenderness however noted to be grimacing on palpation. She was found to have temp of 5F, tachypnea and UA suggestive of UTI.   Assessment & Plan    Principal Problem: Urinary tract infection - Patient met SIRS criteria at the time of admission with temp of 5F, tachypnea RR 27 and UA suggestive of UTI - Follow urine cultures, blood cultures, started on IV Rocephin  Active Problems:   Abdominal hernia with abdominal pain - CT abdomen and pelvis reviewed, most of the small bowel and colon resides in the large right lateral abdominal wall hernia, small area just and right anterolateral hernia contains a portion of the cecum and ileocecal valve, no obstruction perforation or acute inflammation of the bowel within the hernia. Had abdominal hernia surgery 15 years ago, per son would not want another surgery at this time - Given patient still appears to be tender and grimacing on exam, will consult surgery    Fecal impaction with rectal distention - will place on bowel regimen and give enema     Diabetes mellitus ([place]) -Continue sliding scale insulin, CBGs currently controlled    Arthritis - Pain controlled with low-dose IV morphine  Code  Status:Full CODE STATUS  DVT Prophylaxis:  Lovenox  Family Communication: Discussed in detail with the patient, all imaging results, lab results explained to the patient's son at the bedside  Disposition Plan: Per son, will not want to skilled nursing facility. They would like to have home health set up at home and she is ready to be discharged   Time Spent in minutes   25 minutes  Procedures:  CT abd   Consultants:     Antimicrobials:      Medications  Scheduled Meds: . cefTRIAXone (ROCEPHIN)  IV  1 g Intravenous Q24H  . enoxaparin (LOVENOX) injection  40 mg Subcutaneous Q24H  . insulin aspart  0-15 Units Subcutaneous TID WC  . insulin aspart  0-5 Units Subcutaneous QHS  . lisinopril  5 mg Oral Daily  . sodium chloride flush  3 mL Intravenous Q12H  . sorbitol, milk of mag, mineral oil, glycerin (SMOG) enema  960 mL Rectal Once   Continuous Infusions: . sodium chloride 75 mL/hr at 07/01/2016 2344   PRN Meds:.acetaminophen **OR** acetaminophen, hydrALAZINE, Melatonin   Antibiotics   Anti-infectives  Start     Dose/Rate Route Frequency Ordered Stop   06/13/16 1800  cefTRIAXone (ROCEPHIN) 1 g in dextrose 5 % 50 mL IVPB     1 g 100 mL/hr over 30 Minutes Intravenous Every 24 hours 06/14/2016 1914     06/20/2016 1845  cefTRIAXone (ROCEPHIN) 2 g in dextrose 5 % 50 mL IVPB  Status:  Discontinued     2 g 100 mL/hr over 30 Minutes Intravenous  Once 06/19/2016 1839 06/11/2016 1902   06/11/2016 1700  cefTRIAXone (ROCEPHIN) 1 g in dextrose 5 % 50 mL IVPB     1 g 100 mL/hr over 30 Minutes Intravenous  Once 07/02/2016 1647 06/25/2016 2001        Subjective:   Lindsey Garza was seen and examined today.  Grimacing on examination of the abdomen, difficult to obtain review of systems from the patient and keeping her eyes closed. No nausea or vomiting. Son at the bedside. Very weak. No fevers or chills this morning. Denies any chest pain or shortness of breath.    Objective:   Vitals:    06/20/2016 1930 06/28/2016 2040 07/05/2016 2050 06/13/16 0555  BP: 159/79  (!) 156/72 (!) 144/66  Pulse: 76  77 69  Resp: _0 Temp:   97.3 F (36.3 C) 97.3 F (36.3 C)  TempSrc:   Oral Oral  SpO2: 99%  100% (!) 84%  Weight:  60.5 kg (133 lb 6.1 oz)    Height:  _1  (1.575 m)      Intake/Output Summary (Last 24 hours) at 06/13/16 1044 Last data filed at 06/13/16 0929  Gross per 24 hour  Intake           568.75 ml  Output                0 ml  Net           568.75 ml     Wt Readings from Last 3 Encounters:  06/25/2016 60.5 kg (133 lb 6.1 oz)     Exam  General: Alert and Awake, appears to be uncomfortable   HEENT:    Neck: Supple, no JVD  Cardiovascular: S1 S2 auscultated, no rubs, murmurs or gallops. Regular rate and rhythm.  Respiratory: Clear to auscultation bilaterally, no wheezing, rales or rhonchi  Gastrointestinal: Soft,  diffuse tenderness to deep palpation hypoactive bowel sounds   Ext: no cyanosis clubbing or edema  Neuro:moving all 4 extremities   Skin: No rashes  Psych: Uncomfortable, keeping her eyes closed   Data Reviewed:  I have personally reviewed following labs and imaging studies  Micro Results No results found for this or any previous visit (from the past 240 hour(s)).  Radiology Reports Dg Chest 2 View  Result Date: 06/20/2016 CLINICAL DATA:  Weakness and SOB X 2 days. Patient family reports patient has a knot on her right side on the lower part of her chest. HX diabetes EXAM: CHEST  2 VIEW COMPARISON:  None. FINDINGS: Heart size is normal. Mildly prominent interstitial marking are present. No focal consolidations or pleural effusions. No pulmonary edema. IMPRESSION: No evidence for acute cardiopulmonary abnormality. Electronically Signed   By: Nolon Nations M.D.   On: 06/15/2016 15:15   Ct Abdomen Pelvis W Contrast  Result Date: 06/20/2016 CLINICAL DATA:  Worsening generalized weakness and abdominal pain. EXAM: CT ABDOMEN AND PELVIS  WITH CONTRAST TECHNIQUE: Multidetector CT imaging of the abdomen and pelvis was performed using the standard protocol following bolus  administration of intravenous contrast. CONTRAST:  134m ISOVUE-300 IOPAMIDOL (ISOVUE-300) INJECTION 61% COMPARISON:  None. FINDINGS: Lower chest: Mild airspace opacities in both lung bases could be atelectatic or infectious. No effusions. Hepatobiliary: No focal liver abnormality is seen. Status post cholecystectomy. No biliary dilatation. Pancreas: Unremarkable. No pancreatic ductal dilatation or surrounding inflammatory changes. Spleen: Normal in size without focal abnormality. Adrenals/Urinary Tract: Both adrenals are normal. A few low-attenuation lesions are present about each kidney, measuring up to 3 cm. These likely represent simple cysts. No suspicious renal parenchymal lesion is evident. Collecting systems and ureters are unremarkable. Urinary bladder is displaced anteriorly by the distended rectum but otherwise unremarkable. Stomach/Bowel: There is marked rectal distention with stool, measuring up to 10 cm transverse. Stomach is unremarkable. Most of the small bowel and colon reside within a very large right lateral abdominal hernia. No evidence of obstruction within the hernia. No extraluminal gas. No focal inflammation of bowel. Vascular/Lymphatic: The abdominal aorta is normal in caliber with mild atherosclerotic calcification. No adenopathy in the abdomen or pelvis. Reproductive: Multiple uterine calcifications.  No adnexal masses. Other: No ascites. Musculoskeletal: No significant skeletal lesion. Moderately severe facet arthritis from L4 through S1. Very large right lateral abdominal hernia. Adjacent smaller anterolateral hernia. IMPRESSION: 1. Most of the small bowel and colon reside within a large right lateral abdominal wall hernia. Smaller adjacent right anterolateral hernia contains a portion of the cecum and ileocecal valve. No obstruction, perforation or acute  inflammation of bowel within the hernia. 2. Marked rectal distention with stool, likely fecal impaction. The distended rectum displaces the urinary bladder anteriorly. 3. Mild lung base opacities bilaterally, atelectasis versus infection. 4. Low-attenuation renal lesions, probably benign cysts. 5. Severe lower lumbar facet arthritis. Electronically Signed   By: DAndreas NewportM.D.   On: 06/11/2016 20:35    Lab Data:  CBC:  Recent Labs Lab 06/26/2016 1623 06/14/2016 1907 06/13/16 0240  WBC 7.8 8.0 9.0  NEUTROABS 5.7  --   --   HGB 16.4* 14.4 14.9  HCT 47.0* 45.0 47.2*  MCV 93.1 93.6 95.4  PLT 221 209 1330  Basic Metabolic Panel:  Recent Labs Lab 06/17/2016 1623 06/27/2016 1746 06/13/2016 1907 06/13/16 0240  NA 138  --   --  139  K 4.3  --   --  4.0  CL 93*  --   --  95*  CO2 34*  --   --  38*  GLUCOSE 173*  --   --  173*  BUN 15  --   --  12  CREATININE 0.42* 0.39* 0.38* 0.39*  CALCIUM 9.1  --   --  8.9   GFR: Estimated Creatinine Clearance: 49.7 mL/min (by C-G formula based on SCr of 0.39 mg/dL (L)). Liver Function Tests:  Recent Labs Lab 06/29/2016 1623 06/13/16 0240  AST 18 17  ALT QUANTITY NOT SUFFICIENT, UNABLE TO PERFORM TEST 15  ALKPHOS 50 48  BILITOT QUANTITY NOT SUFFICIENT, UNABLE TO PERFORM TEST 0.4  PROT 6.5 6.2*  ALBUMIN 3.5 3.2*   No results for input(s): LIPASE, AMYLASE in the last 168 hours. No results for input(s): AMMONIA in the last 168 hours. Coagulation Profile: No results for input(s): INR, PROTIME in the last 168 hours. Cardiac Enzymes:  Recent Labs Lab 06/19/2016 1623  TROPONINI 0.03*   BNP (last 3 results) No results for input(s): PROBNP in the last 8760 hours. HbA1C: No results for input(s): HGBA1C in the last 72 hours. CBG:  Recent Labs Lab 06/27/2016 2340  06/13/16 0754  GLUCAP 146* 129*   Lipid Profile: No results for input(s): CHOL, HDL, LDLCALC, TRIG, CHOLHDL, LDLDIRECT in the last 72 hours. Thyroid Function Tests: No results  for input(s): TSH, T4TOTAL, FREET4, T3FREE, THYROIDAB in the last 72 hours. Anemia Panel: No results for input(s): VITAMINB12, FOLATE, FERRITIN, TIBC, IRON, RETICCTPCT in the last 72 hours. Urine analysis:    Component Value Date/Time   COLORURINE AMBER (A) 06/25/2016 1549   APPEARANCEUR CLOUDY (A) 06/23/2016 1549   LABSPEC 1.017 06/22/2016 1549   PHURINE 5.0 06/27/2016 1549   GLUCOSEU NEGATIVE 06/19/2016 1549   HGBUR MODERATE (A) 06/26/2016 1549   BILIRUBINUR NEGATIVE 06/11/2016 1549   KETONESUR NEGATIVE 06/20/2016 1549   PROTEINUR NEGATIVE 06/17/2016 1549   NITRITE NEGATIVE 06/15/2016 1549   LEUKOCYTESUR LARGE (A) 06/23/2016 1549     Hisao Doo M.D. [facility] 06/13/2016, 10:44 AM  Pager: [phone] Between 7am to 7pm - call Pager - [phone]  After 7pm go to [email or web address] - password [email or web address]  Call night coverage person covering after 7pm

## 2016-06-13 NOTE — Evaluation (Signed)
Physical Therapy Evaluation Patient Details Name: Lindsey Garza MRN: [id number] DOB: 10/21/1935 Today's Date: 06/13/2016   History of Present Illness   Lindsey JenkinsViolet Bovard is a 81 y.o. female with medical history significant of DM, arthritis, abd hernia who presents with worsening generalized weakness and polyuria over the past  2-3 days prior to hospital admission. Patient lives with her son, who later brought pt to the ED as weakness continued to worsen. Patient does report some generalized abd pain.   Clinical Impression  Pt admitted with above diagnosis. Pt currently with functional limitations due to the deficits listed below (see PT Problem List). Pt very resistant to all mobility today. She did agree to sit EOB and required mod A to do so but wuold not try to stand or ambulate, saying that she couldn't. Pt wants to go home but at this point, family agreeing that unless she can at least transfer at Supervision level, she will need rehab before going home.  Pt will benefit from skilled PT to increase their independence and safety with mobility to allow discharge to the venue listed below.       Follow Up Recommendations SNF;Supervision/Assistance - 24 hour    Equipment Recommendations  None recommended by PT    Recommendations for Other Services       Precautions / Restrictions Precautions Precautions: Fall Precaution Comments: pt fell weak priort to coming in, has not fallen before that Restrictions Weight Bearing Restrictions: No      Mobility  Bed Mobility Overal bed mobility: Needs Assistance Bed Mobility: Rolling;Sidelying to Sit;Sit to Supine Rolling: Mod assist Sidelying to sit: Mod assist   Sit to supine: Max assist   General bed mobility comments: pt did not want to move at all but hesitantly agreeable to sit EOB. Required mod A to roll left and elevate trunk to sitting. Max A for legs back into bed to return to supine  Transfers                 General  transfer comment: pt admantly refused saying that she could not stand or walk  Ambulation/Gait             General Gait Details: refused  Stairs            Wheelchair Mobility    Modified Rankin (Stroke Patients Only)       Balance Overall balance assessment: Needs assistance;History of Falls Sitting-balance support: Bilateral upper extremity supported Sitting balance-Leahy Scale: Poor Sitting balance - Comments: needed UE support as well as close supervision, fwd lean                                     Pertinent Vitals/Pain Pain Assessment: Faces Faces Pain Scale: Hurts a little bit Pain Location: abdomen Pain Descriptors / Indicators: Constant Pain Intervention(s): Limited activity within patient's tolerance;Monitored during session    Home Living Family/patient expects to be discharged to:: Private residence Living Arrangements: Children Available Help at Discharge: Family;Available 24 hours/day Type of Home: House         Home Equipment: Walker - 2 wheels;Bedside commode;Tub bench      Prior Function Level of Independence: Needs assistance   Gait / Transfers Assistance Needed: pt able to walk throughout home with RW and supervision  ADL's / Homemaking Assistance Needed: family helped her in and out of shower        Hand Dominance  Extremity/Trunk Assessment   Upper Extremity Assessment Upper Extremity Assessment: Defer to OT evaluation    Lower Extremity Assessment Lower Extremity Assessment: Generalized weakness    Cervical / Trunk Assessment Cervical / Trunk Assessment: Kyphotic  Communication   Communication: No difficulties  Cognition Arousal/Alertness: Awake/alert Behavior During Therapy: Anxious Overall Cognitive Status: History of cognitive impairments - at baseline                      General Comments General comments (skin integrity, edema, etc.): noted large right sided abdominal  hernia    Exercises     Assessment/Plan    PT Assessment Patient needs continued PT services  PT Problem List Decreased strength;Decreased activity tolerance;Decreased range of motion;Decreased balance;Decreased mobility;Decreased cognition;Decreased knowledge of use of DME;Decreased knowledge of precautions;Pain          PT Treatment Interventions DME instruction;Gait training;Functional mobility training;Therapeutic activities;Therapeutic exercise;Balance training;Stair training;Cognitive remediation;Patient/family education    PT Goals (Current goals can be found in the Care Plan section)  Acute Rehab PT Goals Patient Stated Goal: go home PT Goal Formulation: With patient Time For Goal Achievement: 06/27/16 Potential to Achieve Goals: Fair    Frequency Min 3X/week   Barriers to discharge   pt needs to be able to transfer at supervision level to go home with family    Co-evaluation               End of Session   Activity Tolerance: Treatment limited secondary to agitation Patient left: in bed;with call bell/phone within reach;with bed alarm set;with family/visitor present Nurse Communication: Mobility status         Time: 1130-1148 PT Time Calculation (min) (ACUTE ONLY): 18 min   Charges:   PT Evaluation $PT Eval Moderate Complexity: 1 Procedure     PT G Codes:       Lyanne Co, PT  Acute Rehab Services  [phone]  Lawana Chambers Hlee Fringer 06/13/2016, 2:39 PM

## 2016-06-14 LAB — URINE CULTURE

## 2016-06-14 LAB — CBC
HCT: 44.7 % (ref 36.0–46.0)
Hemoglobin: 14 g/dL (ref 12.0–15.0)
MCH: 29.9 pg (ref 26.0–34.0)
MCHC: 31.3 g/dL (ref 30.0–36.0)
MCV: 95.5 fL (ref 78.0–100.0)
Platelets: 218 10*3/uL (ref 150–400)
RBC: 4.68 MIL/uL (ref 3.87–5.11)
RDW: 12.7 % (ref 11.5–15.5)
WBC: 8.3 10*3/uL (ref 4.0–10.5)

## 2016-06-14 LAB — BASIC METABOLIC PANEL
ANION GAP: 8 (ref 5–15)
BUN: 10 mg/dL (ref 6–20)
CALCIUM: 8.5 mg/dL — AB (ref 8.9–10.3)
CO2: 39 mmol/L — ABNORMAL HIGH (ref 22–32)
Chloride: 88 mmol/L — ABNORMAL LOW (ref 101–111)
Creatinine, Ser: 0.37 mg/dL — ABNORMAL LOW (ref 0.44–1.00)
GFR calc Af Amer: >60 mL/min (ref >60)
GLUCOSE: 149 mg/dL — AB (ref 65–99)
POTASSIUM: 5 mmol/L (ref 3.5–5.1)
Sodium: 135 mmol/L (ref 135–145)

## 2016-06-14 LAB — GLUCOSE, CAPILLARY
GLUCOSE-CAPILLARY: 144 mg/dL — AB (ref 65–99)
GLUCOSE-CAPILLARY: 158 mg/dL — AB (ref 65–99)
Glucose-Capillary: 136 mg/dL — ABNORMAL HIGH (ref 65–99)

## 2016-06-14 MED ORDER — MORPHINE SULFATE (PF) 2 MG/ML IV SOLN: 0.5000 mg | Freq: Four times a day (QID) | INTRAVENOUS | Status: DC | PRN

## 2016-06-14 NOTE — Progress Notes (Signed)
[facility]                                                                              Patient Demographics  Lindsey Garza, is a 81 y.o. female, DOB - 31-Aug-1935, [place] date - 07/05/2016   Admitting Physician Donne Hazel, MD  Outpatient Primary MD for the patient is PROVIDER NOT IN SYSTEM  Outpatient specialists:   LOS - 2  days    Chief Complaint  Patient presents with  . Weakness       Brief summary   Patient is a 81 year old female with diabetes, arthritis, abdominal hernia presented with worsening generalized weakness, polyuria over the last 2-3 days. Patient also reported generalized abdominal pain, shortness of breath. She also reported soft mass over the right upper quadrant. Initially denied a tenderness however noted to be grimacing on palpation. She was found to have temp of 37F, tachypnea and UA suggestive of UTI.   Assessment & Plan    Principal Problem: Urinary tract infection - Patient met SIRS criteria at the time of admission with temp of 37F, tachypnea RR 27 and UA suggestive of UTI - Blood cultures negative so far, urine culture showed multiple species    Active Problems:   Abdominal hernia with abdominal pain - CT abdomen and pelvis reviewed, most of the small bowel and colon resides in the large right lateral abdominal wall hernia, small area just and right anterolateral hernia contains a portion of the cecum and ileocecal valve, no obstruction perforation or acute inflammation of the bowel within the hernia. Had abdominal hernia surgery 15 years ago, per son would not want another surgery at this time - Gen. surgery consulted, no indication for urgent repair. If she develops pain or desires repair she will need to be referred to hernia specialist in tertiary setting.  Fecal impaction with rectal distention - Resolved per son at the bedside had multiple bowel movements yesterday -  will place on bowel regiment upon  DC    Diabetes mellitus ([place]) -Continue sliding scale insulin, CBGs currently controlled    Arthritis - Pain controlled with low-dose IV morphine, Tylenol  Code Status:Full CODE STATUS  DVT Prophylaxis:  Lovenox  Family Communication: Discussed in detail with the patient, all imaging results, lab results explained to the patient's son at the bedside  Disposition Plan: Per son, will not want to skilled nursing facility. They would like to have home health set up at home and she is ready to be discharged PT evaluation recommended skilled nursing facility  Time Spent in minutes   25 minutes  Procedures:  CT abd   Consultants:   Gen. surgery  Antimicrobials:   IV Rocephin   Medications  Scheduled Meds: . cefTRIAXone (ROCEPHIN)  IV  1 g Intravenous Q24H  . enoxaparin (LOVENOX) injection  40 mg Subcutaneous Q24H  . insulin aspart  0-15 Units Subcutaneous TID WC  . insulin aspart  0-5 Units Subcutaneous QHS  . lisinopril  5 mg Oral Daily  . sertraline  25 mg Oral Daily  . sodium chloride flush  3 mL Intravenous Q12H   Continuous Infusions: .  sodium chloride 75 mL/hr at 06/14/16 0048   PRN Meds:.acetaminophen **OR** acetaminophen, hydrALAZINE, Melatonin, morphine injection   Antibiotics   Anti-infectives    Start     Dose/Rate Route Frequency Ordered Stop   06/13/16 1800  cefTRIAXone (ROCEPHIN) 1 g in dextrose 5 % 50 mL IVPB     1 g 100 mL/hr over 30 Minutes Intravenous Every 24 hours 06/28/2016 1914     06/27/2016 1845  cefTRIAXone (ROCEPHIN) 2 g in dextrose 5 % 50 mL IVPB  Status:  Discontinued     2 g 100 mL/hr over 30 Minutes Intravenous  Once 07/04/2016 1839 06/28/2016 1902   06/30/2016 1700  cefTRIAXone (ROCEPHIN) 1 g in dextrose 5 % 50 mL IVPB     1 g 100 mL/hr over 30 Minutes Intravenous  Once 06/11/2016 1647 06/20/2016 2001        Subjective:   Lindsey Garza was seen and examined today. Resting, but son at the bedside had eventful night with multiple bowel  movements. Feels she is wiped out today and exhausted. No fevers or chills.  No nausea or vomiting. Very weak. No chest pain or shortness of breath.  Objective:   Vitals:   06/13/16 0555 06/13/16 1700 06/13/16 2134 06/14/16 0729  BP: (!) 144/66 140/70 127/71 133/71  Pulse: 69 84 73 75  Resp: 17 18 17 17   Temp: 97.3 F (36.3 C) 97.7 F (36.5 C) 97.7 F (36.5 C) 97.4 F (36.3 C)  TempSrc: Oral Oral Oral   SpO2: (!) 84% 96% 96% 100%  Weight:      Height:       No intake or output data in the 24 hours ending 06/14/16 1234   Wt Readings from Last 3 Encounters:  06/26/2016 60.5 kg (133 lb 6.1 oz)     Exam  General:  Somewhat sleepy otherwise comfortable, arousable  HEENT:    Neck: Supple, no JVD  Cardiovascular: S1 S2 clear, RRR  Respiratory: CTAB  Gastrointestinal: Soft, nontender, large hernia shifted to the right side of the abdomen   Ext: no cyanosis clubbing or edema  Neuro: no new deficits  Skin: No rashes  Psych:    Data Reviewed:  I have personally reviewed following labs and imaging studies  Micro Results Recent Results (from the past 240 hour(s))  Urine culture     Status: Abnormal   Collection Time: 06/24/2016  3:49 PM  Result Value Ref Range Status   Specimen Description URINE, RANDOM  Final   Special Requests NONE  Final   Culture MULTIPLE SPECIES PRESENT, SUGGEST RECOLLECTION (A)  Final   Report Status 06/14/2016 FINAL  Final  Culture, blood (x 2)     Status: None (Preliminary result)   Collection Time: 06/20/2016  7:09 PM  Result Value Ref Range Status   Specimen Description BLOOD RIGHT HAND  Final   Special Requests IN PEDIATRIC BOTTLE 2CC  Final   Culture NO GROWTH < 24 HOURS  Final   Report Status PENDING  Incomplete  Culture, blood (x 2)     Status: None (Preliminary result)   Collection Time: 06/27/2016  7:20 PM  Result Value Ref Range Status   Specimen Description BLOOD LEFT HAND  Final   Special Requests BOTTLES DRAWN AEROBIC ONLY 5CC   Final   Culture NO GROWTH < 24 HOURS  Final   Report Status PENDING  Incomplete    Radiology Reports Dg Chest 2 View  Result Date: 06/13/2016 CLINICAL DATA:  Weakness and SOB  X 2 days. Patient family reports patient has a knot on her right side on the lower part of her chest. HX diabetes EXAM: CHEST  2 VIEW COMPARISON:  None. FINDINGS: Heart size is normal. Mildly prominent interstitial marking are present. No focal consolidations or pleural effusions. No pulmonary edema. IMPRESSION: No evidence for acute cardiopulmonary abnormality. Electronically Signed   By: Nolon Nations M.D.   On: 06/24/2016 15:15   Ct Abdomen Pelvis W Contrast  Result Date: 06/14/2016 CLINICAL DATA:  Worsening generalized weakness and abdominal pain. EXAM: CT ABDOMEN AND PELVIS WITH CONTRAST TECHNIQUE: Multidetector CT imaging of the abdomen and pelvis was performed using the standard protocol following bolus administration of intravenous contrast. CONTRAST:  132m ISOVUE-300 IOPAMIDOL (ISOVUE-300) INJECTION 61% COMPARISON:  None. FINDINGS: Lower chest: Mild airspace opacities in both lung bases could be atelectatic or infectious. No effusions. Hepatobiliary: No focal liver abnormality is seen. Status post cholecystectomy. No biliary dilatation. Pancreas: Unremarkable. No pancreatic ductal dilatation or surrounding inflammatory changes. Spleen: Normal in size without focal abnormality. Adrenals/Urinary Tract: Both adrenals are normal. A few low-attenuation lesions are present about each kidney, measuring up to 3 cm. These likely represent simple cysts. No suspicious renal parenchymal lesion is evident. Collecting systems and ureters are unremarkable. Urinary bladder is displaced anteriorly by the distended rectum but otherwise unremarkable. Stomach/Bowel: There is marked rectal distention with stool, measuring up to 10 cm transverse. Stomach is unremarkable. Most of the small bowel and colon reside within a very large right  lateral abdominal hernia. No evidence of obstruction within the hernia. No extraluminal gas. No focal inflammation of bowel. Vascular/Lymphatic: The abdominal aorta is normal in caliber with mild atherosclerotic calcification. No adenopathy in the abdomen or pelvis. Reproductive: Multiple uterine calcifications.  No adnexal masses. Other: No ascites. Musculoskeletal: No significant skeletal lesion. Moderately severe facet arthritis from L4 through S1. Very large right lateral abdominal hernia. Adjacent smaller anterolateral hernia. IMPRESSION: 1. Most of the small bowel and colon reside within a large right lateral abdominal wall hernia. Smaller adjacent right anterolateral hernia contains a portion of the cecum and ileocecal valve. No obstruction, perforation or acute inflammation of bowel within the hernia. 2. Marked rectal distention with stool, likely fecal impaction. The distended rectum displaces the urinary bladder anteriorly. 3. Mild lung base opacities bilaterally, atelectasis versus infection. 4. Low-attenuation renal lesions, probably benign cysts. 5. Severe lower lumbar facet arthritis. Electronically Signed   By: DAndreas NewportM.D.   On: 07/01/2016 20:35    Lab Data:  CBC:  Recent Labs Lab 06/27/2016 1623 06/17/2016 1907 06/13/16 0240 06/14/16 0500  WBC 7.8 8.0 9.0 8.3  NEUTROABS 5.7  --   --   --   HGB 16.4* 14.4 14.9 14.0  HCT 47.0* 45.0 47.2* 44.7  MCV 93.1 93.6 95.4 95.5  PLT 221 209 183 2409  Basic Metabolic Panel:  Recent Labs Lab 06/11/2016 1623 06/11/2016 1746 06/15/2016 1907 06/13/16 0240 06/14/16 0500  NA 138  --   --  139 135  K 4.3  --   --  4.0 5.0  CL 93*  --   --  95* 88*  CO2 34*  --   --  38* 39*  GLUCOSE 173*  --   --  173* 149*  BUN 15  --   --  12 10  CREATININE 0.42* 0.39* 0.38* 0.39* 0.37*  CALCIUM 9.1  --   --  8.9 8.5*   GFR: Estimated Creatinine Clearance: 49.7 mL/min (by C-G  formula based on SCr of 0.37 mg/dL (L)). Liver Function  Tests:  Recent Labs Lab 07/05/2016 1623 06/13/16 0240  AST 18 17  ALT QUANTITY NOT SUFFICIENT, UNABLE TO PERFORM TEST 15  ALKPHOS 50 48  BILITOT QUANTITY NOT SUFFICIENT, UNABLE TO PERFORM TEST 0.4  PROT 6.5 6.2*  ALBUMIN 3.5 3.2*   No results for input(s): LIPASE, AMYLASE in the last 168 hours. No results for input(s): AMMONIA in the last 168 hours. Coagulation Profile: No results for input(s): INR, PROTIME in the last 168 hours. Cardiac Enzymes:  Recent Labs Lab 06/30/2016 1623  TROPONINI 0.03*   BNP (last 3 results) No results for input(s): PROBNP in the last 8760 hours. HbA1C:  Recent Labs  06/14/2016 1907  HGBA1C 7.9*   CBG:  Recent Labs Lab 06/13/16 1224 06/13/16 1806 06/13/16 2136 06/14/16 0750 06/14/16 1220  GLUCAP 139* 156* 174* 144* 158*   Lipid Profile: No results for input(s): CHOL, HDL, LDLCALC, TRIG, CHOLHDL, LDLDIRECT in the last 72 hours. Thyroid Function Tests: No results for input(s): TSH, T4TOTAL, FREET4, T3FREE, THYROIDAB in the last 72 hours. Anemia Panel: No results for input(s): VITAMINB12, FOLATE, FERRITIN, TIBC, IRON, RETICCTPCT in the last 72 hours. Urine analysis:    Component Value Date/Time   COLORURINE AMBER (A) 06/25/2016 1549   APPEARANCEUR CLOUDY (A) 06/11/2016 1549   LABSPEC 1.017 07/06/2016 1549   PHURINE 5.0 06/25/2016 1549   GLUCOSEU NEGATIVE 06/15/2016 1549   HGBUR MODERATE (A) 06/26/2016 1549   BILIRUBINUR NEGATIVE 06/15/2016 1549   KETONESUR NEGATIVE 06/20/2016 1549   PROTEINUR NEGATIVE 07/01/2016 1549   NITRITE NEGATIVE 06/10/2016 1549   LEUKOCYTESUR LARGE (A) 06/15/2016 1549     Derold Dorsch M.D. [facility] 06/14/2016, 12:34 PM  Pager: [phone] Between 7am to 7pm - call Pager - [phone]  After 7pm go to [email or web address] - password [email or web address]  Call night coverage person covering after 7pm

## 2016-06-14 NOTE — Progress Notes (Signed)
S: Complains of trouble swallowing  Vitals, labs, intake/output, and orders reviewed at this time.  Gen: Alert, cooperative Abd: soft,nontender, large hernia to right side of abdomen Ext: warm, no edema   A/P:  HD 3 with Uti _large ventral hernia with loss of domain: no indication for urgent repair. If she develops pain from it or desires repair she will need to be referred to a hernia specialist in a tertiary setting.  -constipation: improving continue bowel regimen  General surgery will sign off at this time. Please contact us with questions or concerns at any time.    Phylliss Blakeshelsea Jana Swartzlander, MD [facility], [place] Pager [phone]

## 2016-06-14 NOTE — Evaluation (Signed)
Clinical/Bedside Swallow Evaluation Lindsey Garza Details  Name: Lindsey Garza [place] MRN: [id number] Date of Birth: 09/01/1935  Today's Date: 06/14/2016 Time: SLP Start Time (ACUTE ONLY): 1535 SLP Stop Time (ACUTE ONLY): 1554 SLP Time Calculation (min) (ACUTE ONLY): 19 min  Past Medical History:  Past Medical History:  Diagnosis Date  . Arthritis   . Diabetes mellitus without complication [facility](HCC)    Past Surgical History:  Past Surgical History:  Procedure Laterality Date  . CHOLECYSTECTOMY    . HERNIA REPAIR     HPI:  81 y.o. female with medical history significant of DM, arthritis, abd hernia who presents with worsening generalized weakness and polyuria over the past  2-3 days prior to hospital admission. Lindsey Garza lives with her granddaughter/son, who later brought pt to the ED as weakness continued to worsen. Lindsey Garza does report some generalized abd pain. Reports some sob. No coughing, but does report some runny nose. Lindsey Garza reports last UTI was "many years ago." Is managing DM through diet primarily. Lindsey Garza also reports soft mass over RUQ that has been present over the past several months. Initially denied tenderness, however pt noted to grimace on palpation over lesion in ED; CXR 06/15/2016 indicated No evidence for acute cardiopulmonary abnormality  Assessment / Plan / Recommendation Clinical Impression   Pt with multiple audible swallows with all consistencies including thin, puree and solids; granddaughter noted she "coughs sometimes" during meals d/t xerostomia; vocal quality hoarse with low vocal intensity throughout BSE; Recommend MBS to r/o aspiration and recommend safest diet; Dysphagia 2/thin diet with swallowing precautions implemented to reduce mild risk for aspiration; ST will f/u after instrumental examination if warranted.    Aspiration Risk  Mild aspiration risk    Diet Recommendation   Dysphagia 2 (minced)/thin liquids  Medication Administration: Whole meds with puree    Other   Recommendations Oral Care Recommendations: Oral care BID   Follow up Recommendations Other (comment) (TBD)      Frequency and Duration min 2x/week  1 week       Prognosis Prognosis for Safe Diet Advancement: Good      Swallow Study   General Date of Onset: 06/28/2016 HPI: 81 y.o. female with medical history significant of DM, arthritis, abd hernia who presents with worsening generalized weakness and polyuria over the past  2-3 days prior to hospital admission. Lindsey Garza lives with her son, who later brought pt to the ED as weakness continued to worsen. Lindsey Garza does report some generalized abd pain. Reports some sob. No coughing, but does report some runny nose. Lindsey Garza reports last UTI was "many years ago." Is managing DM through diet primarily. Lindsey Garza also reports soft mass over RUQ that has been present over the past several months. Initially denied tenderness, however pt noted to grimace on palpation over lesion in ED. Type of Study: Bedside Swallow Evaluation Diet Prior to this Study: Dysphagia 2 (chopped);Thin liquids Temperature Spikes Noted: No Respiratory Status: Nasal cannula History of Recent Intubation: No Behavior/Cognition: Alert;Requires cueing;Cooperative Oral Cavity Assessment: Dry Oral Care Completed by SLP: No Oral Cavity - Dentition: Edentulous;Other (Comment) (has dentures; not available; ill-fitting) Vision: Functional for self-feeding Self-Feeding Abilities: Needs assist;Able to feed self Lindsey Garza Positioning: Upright in bed Baseline Vocal Quality: Hoarse;Low vocal intensity Volitional Cough: Weak Volitional Swallow: Unable to elicit (secondary to xerostomia)    Oral/Motor/Sensory Function Overall Oral Motor/Sensory Function: Within functional limits   Ice Chips Ice chips: Not tested   Thin Liquid Thin Liquid: Impaired Presentation: Cup;Straw;Spoon Oral Phase Functional Implications: Oral holding Pharyngeal  Phase Impairments: Multiple swallows    Nectar  Thick Nectar Thick Liquid: Not tested   Honey Thick Honey Thick Liquid: Not tested   Puree Puree: Impaired Presentation: Spoon Pharyngeal Phase Impairments: Multiple swallows;Other (comments) (audible swallow)   Solid      Solid: Impaired Presentation: Spoon Pharyngeal Phase Impairments: Multiple swallows    Functional Assessment Tool Used: NOMS Functional Limitations: Swallowing Swallow Current Status (Z6109): At least 20 percent but less than 40 percent impaired, limited or restricted Swallow Goal Status [phone]): At least 1 percent but less than 20 percent impaired, limited or restricted   Candida Vetter,PAT, M.S., CCC-SLP 06/14/2016,4:31 PM

## 2016-06-14 NOTE — Care Management Note (Signed)
Case Management Note  Patient Details  Name: Lindsey Garza MRN: [id number] Date of Birth: 05/05/1936  Subjective/Objective:                 Spoke with patient and family in room. Intention is to DC to home. Patient has walker and BSC at home, decline needs for additional DME. Patient has used [facility] in the past and would like to use them again for full services HH RN PT OT HHA after discharge. [facility]    Action/Plan:  [facility]. Needs HH orders with [facility].  Expected Discharge Date:                  Expected Discharge Plan:  Home w Home Health Services  In-House Referral:     Discharge planning Services  CM Consult  Post Acute Care Choice:    Choice offered to:  Patient, Adult Children  DME Arranged:    DME Agency:     HH Arranged:  RN, PT, OT, Nurse's Aide HH Agency:  [facility]  Status of Service:  In process, will continue to follow  If discussed at Long Length of Stay Meetings, dates discussed:    Additional Comments:  Lawerance SabalDebbie Jaking Thayer, RN 06/14/2016, 1:39 PM

## 2016-06-14 NOTE — Evaluation (Signed)
Occupational Therapy Evaluation Patient Details Name: Lindsey JenkinsViolet Garza MRN: [id number] DOB: 06/23/1935 Today's Date: 06/14/2016    History of Present Illness  Lindsey Garza is a 81 y.o. female with medical history significant of DM, arthritis, abd hernia who presents with worsening generalized weakness and polyuria over the past  2-3 days prior to hospital admission. Patient lives with her son, who later brought pt to the ED as weakness continued to worsen. Patient does report some generalized abd pain.    Clinical Impression    Upon entering the room, pt supine in bed with caregiver , granddaughter, present in room during evaluation. Patient presenting with decreased I in self care, balance, functional transfers/mobility, and safety. Patient required min A with self care per caregivers report PTA. Patient currently functioning at mod - max A. Mod A for sit <>stand onto EOB. Pt appears to be very anxious with movement and does better if given increased time and detailed instructions for movement. Pt refuses functional transfer this session.  Patient will benefit from acute OT to increase overall independence in the areas of ADLs, functional mobility,and safety in order to safely discharge home with family. Caregiver present and reports they can offer the amount of assistance she currently needs. Pt refusing SNF at this time and requests home with HHOT.     Follow Up Recommendations  Home health OT;Supervision/Assistance - 24 hour    Equipment Recommendations  None recommended by OT    Recommendations for Other Services       Precautions / Restrictions Precautions Precautions: Fall Precaution Comments: pt fell weak priort to coming in, has not fallen before that Restrictions Weight Bearing Restrictions: No      Mobility Bed Mobility Overal bed mobility: Needs Assistance Bed Mobility: Rolling;Sidelying to Sit;Sit to Supine Rolling: Mod assist Sidelying to sit: Mod assist   Sit to  supine: Max assist   General bed mobility comments: pt did not want to move at all but hesitantly agreeable to sit EOB. Required mod A to roll left and elevate trunk to sitting. Max A for legs back into bed to return to supine  Transfers                 General transfer comment: Pt refuses but OT continues to encourage     Balance Overall balance assessment: Needs assistance;History of Falls Sitting-balance support: Bilateral upper extremity supported Sitting balance-Leahy Scale: Fair Sitting balance - Comments: UE support and close supervision for safety              ADL Overall ADL's : Needs assistance/impaired     Grooming: Set up;Sitting   Upper Body Bathing: Minimal assistance;Sitting   Lower Body Bathing: Maximal assistance   Upper Body Dressing : Set up;Sitting   Lower Body Dressing: Total assistance        General ADL Comments: Pt supine with caregiver present in room. Pt initially refusing OOB tasks this session but agreeable with further coaxing. Pt very anxious with movement and needs increased time and details regarding steps for tasks.                Pertinent Vitals/Pain Pain Assessment: Faces Faces Pain Scale: Hurts a little bit Pain Location: asbdomen Pain Descriptors / Indicators: Constant Pain Intervention(s): Monitored during session;Repositioned     Hand Dominance Right   Extremity/Trunk Assessment Upper Extremity Assessment Upper Extremity Assessment: Generalized weakness;LUE deficits/detail LUE Deficits / Details: decreased AROM and strength LUE: Unable to fully assess due to pain  Lower Extremity Assessment Lower Extremity Assessment: Defer to PT evaluation   Cervical / Trunk Assessment Cervical / Trunk Assessment: Kyphotic   Communication Communication Communication: No difficulties   Cognition Arousal/Alertness: Awake/alert Behavior During Therapy: Anxious Overall Cognitive Status: History of cognitive impairments -  at baseline                        Home Living Family/patient expects to be discharged to:: Private residence Living Arrangements: Children Available Help at Discharge: Family;Available 24 hours/day Type of Home: House Home Access: Stairs to enter [employer] of Steps: 2 STE Entrance Stairs-Rails: None Home Layout: Two level;Able to live on main level with bedroom/bathroom     Bathroom Shower/Tub: Tub/shower unit Shower/tub characteristics: Engineer, building services: Standard     Home Equipment: Environmental consultant - 2 wheels;Bedside commode;Tub bench          Prior Functioning/Environment Level of Independence: Needs assistance  Gait / Transfers Assistance Needed: pt able to walk throughout home with RW and supervision ADL's / Homemaking Assistance Needed: family helped her in and out of shower- reports giving her min A            OT Problem List: Decreased strength;Decreased activity tolerance;Impaired balance (sitting and/or standing);Decreased safety awareness;Pain;Impaired UE functional use;Decreased knowledge of use of DME or AE   OT Treatment/Interventions: Self-care/ADL training;Therapeutic exercise;Neuromuscular education;Energy conservation;DME and/or AE instruction;Therapeutic activities;Manual therapy;Patient/family education;Balance training    OT Goals(Current goals can be found in the care plan section) Acute Rehab OT Goals Patient Stated Goal: go home OT Goal Formulation: With patient/family Time For Goal Achievement: 06/28/16 Potential to Achieve Goals: Fair ADL Goals Pt Will Perform Grooming: with supervision Pt Will Perform Upper Body Bathing: with set-up;with supervision Pt Will Perform Lower Body Bathing: with min assist Pt Will Perform Upper Body Dressing: with set-up;with supervision Pt Will Perform Lower Body Dressing: with min assist Pt Will Transfer to Toilet: with min assist Pt Will Perform Toileting - Clothing Manipulation and hygiene:  with min assist Pt Will Perform Tub/Shower Transfer: with min assist  OT Frequency: Min 2X/week   Barriers to D/C:    none known at this time          End of Session Equipment Utilized During Treatment: Oxygen Nurse Communication: Mobility status  Activity Tolerance: Patient limited by fatigue Patient left: in bed;with call bell/phone within reach;with family/visitor present   Time: 1350-1414 OT Time Calculation (min): 24 min Charges:  OT General Charges $OT Visit: 1 Procedure OT Evaluation $OT Eval Moderate Complexity: 1 Procedure OT Treatments $Therapeutic Activity: 8-22 mins G-Codes:    Peyten Punches P, MS, OTR/L, CBIS 06/14/2016, 2:26 PM

## 2016-06-15 ENCOUNTER — Inpatient Hospital Stay (HOSPITAL_COMMUNITY): Payer: Medicare Other

## 2016-06-15 ENCOUNTER — Encounter (HOSPITAL_COMMUNITY): Payer: Self-pay | Admitting: General Practice

## 2016-06-15 LAB — CBC
HCT: 44.8 % (ref 36.0–46.0)
Hemoglobin: 14.6 g/dL (ref 12.0–15.0)
MCH: 30.5 pg (ref 26.0–34.0)
MCHC: 32.6 g/dL (ref 30.0–36.0)
MCV: 93.5 fL (ref 78.0–100.0)
PLATELETS: 167 10*3/uL (ref 150–400)
RBC: 4.79 MIL/uL (ref 3.87–5.11)
RDW: 12.6 % (ref 11.5–15.5)
WBC: 6.8 10*3/uL (ref 4.0–10.5)

## 2016-06-15 LAB — GLUCOSE, CAPILLARY
GLUCOSE-CAPILLARY: 108 mg/dL — AB (ref 65–99)
GLUCOSE-CAPILLARY: 172 mg/dL — AB (ref 65–99)
GLUCOSE-CAPILLARY: 161 mg/dL — AB (ref 65–99)
Glucose-Capillary: 107 mg/dL — ABNORMAL HIGH (ref 65–99)
Glucose-Capillary: 147 mg/dL — ABNORMAL HIGH (ref 65–99)
Glucose-Capillary: 176 mg/dL — ABNORMAL HIGH (ref 65–99)

## 2016-06-15 LAB — BASIC METABOLIC PANEL
ANION GAP: 11 (ref 5–15)
BUN: 8 mg/dL (ref 6–20)
CO2: 32 mmol/L (ref 22–32)
Calcium: 7.5 mg/dL — ABNORMAL LOW (ref 8.9–10.3)
Chloride: 94 mmol/L — ABNORMAL LOW (ref 101–111)
Creatinine, Ser: <0.3 mg/dL — ABNORMAL LOW (ref 0.44–1.00)
Glucose, Bld: 100 mg/dL — ABNORMAL HIGH (ref 65–99)
POTASSIUM: 4 mmol/L (ref 3.5–5.1)
Sodium: 137 mmol/L (ref 135–145)

## 2016-06-15 LAB — BLOOD GAS, ARTERIAL
ACID-BASE EXCESS: 18.2 mmol/L — AB (ref 0.0–2.0)
Bicarbonate: 47.1 mmol/L — ABNORMAL HIGH (ref 20.0–28.0)
Drawn by: 418751
FIO2: 100
O2 Saturation: 99 %
PH ART: 7.191 — AB (ref 7.350–7.450)
Patient temperature: 95.8
pO2, Arterial: 216 mmHg — ABNORMAL HIGH (ref 83.0–108.0)

## 2016-06-15 LAB — BRAIN NATRIURETIC PEPTIDE: B NATRIURETIC PEPTIDE 5: 274.7 pg/mL — AB (ref 0.0–100.0)

## 2016-06-15 MED ORDER — FUROSEMIDE 20 MG PO TABS
20.0000 mg | ORAL_TABLET | Freq: Once | ORAL | Status: AC
  Administered 2016-06-15: 20 mg via ORAL
  Filled 2016-06-15: qty 1

## 2016-06-15 MED ORDER — NALOXONE HCL 0.4 MG/ML IJ SOLN
INTRAMUSCULAR | Status: AC
  Filled 2016-06-15: qty 1

## 2016-06-15 MED ORDER — RESOURCE THICKENUP CLEAR PO POWD
ORAL | Status: DC | PRN
  Filled 2016-06-15: qty 125

## 2016-06-15 MED ORDER — VANCOMYCIN HCL IN DEXTROSE 750-5 MG/150ML-% IV SOLN
750.0000 mg | Freq: Two times a day (BID) | INTRAVENOUS | Status: DC
  Filled 2016-06-15: qty 150

## 2016-06-15 MED ORDER — ENSURE ENLIVE PO LIQD
237.0000 mL | Freq: Two times a day (BID) | ORAL | Status: DC
  Administered 2016-06-15: 237 mL via ORAL

## 2016-06-15 MED ORDER — NALOXONE HCL 0.4 MG/ML IJ SOLN
0.4000 mg | INTRAMUSCULAR | Status: DC | PRN
  Administered 2016-06-15: 0.4 mg via INTRAVENOUS

## 2016-06-15 MED ORDER — PIPERACILLIN-TAZOBACTAM 3.375 G IVPB
3.3750 g | Freq: Three times a day (TID) | INTRAVENOUS | Status: DC
  Filled 2016-06-15 (×2): qty 50

## 2016-06-15 MED ORDER — ONDANSETRON HCL 4 MG/2ML IJ SOLN
4.0000 mg | Freq: Four times a day (QID) | INTRAMUSCULAR | Status: DC | PRN
  Administered 2016-06-15: 4 mg via INTRAVENOUS
  Filled 2016-06-15: qty 2

## 2016-06-15 NOTE — Progress Notes (Signed)
[facility]                                                                              Patient Demographics  Lindsey Garza, is a 81 y.o. female, DOB - 07/16/1935, [place] date - 06/10/2016   Admitting Physician Donne Hazel, MD  Outpatient Primary MD for the patient is PROVIDER NOT [facility]  Outpatient specialists:   LOS - 3  days    Chief Complaint  Patient presents with  . Weakness       Brief summary   Patient is a 81 year old female with diabetes, arthritis, abdominal hernia presented with worsening generalized weakness, polyuria over the last 2-3 days. Patient also reported generalized abdominal pain, shortness of breath. She also reported soft mass over the right upper quadrant. Initially denied a tenderness however noted to be grimacing on palpation. She was found to have temp of 35F, tachypnea and UA suggestive of UTI.   Assessment & Plan    Principal Problem: Urinary tract infection - Patient met SIRS criteria at the time of admission with temp of 35F, tachypnea RR 27 and UA suggestive of UTI - Blood cultures negative so far, urine culture showed multiple species  - Patient's family requesting to repeat another UA, added another UA and culture   Active Problems:   Abdominal hernia with abdominal pain - CT abdomen and pelvis reviewed, most of the small bowel and colon resides in the large right lateral abdominal wall hernia, small area just and right anterolateral hernia contains a portion of the cecum and ileocecal valve, no obstruction perforation or acute inflammation of the bowel within the hernia. Had abdominal hernia surgery 15 years ago, per son would not want another surgery at this time - Gen. surgery consulted, no indication for urgent repair. If she develops pain or desires repair she will need to be referred to hernia specialist in tertiary setting.  Fecal impaction with rectal distention - Resolved per son at the  bedside had multiple bowel movements  -  will place on bowel regiment upon DC - repeat abdominal x-ray shows no obstruction, stool blood and has improved  Shortness of breath likely due to fluid overload - Patient complaining of shortness of breath this morning, will stop IV fluids, her chest x-ray obtained which showed left lower lobe atelectasis or pneumonia, small left pleural effusion - will give Lasix 20 mg 1    Diabetes mellitus (HCC) -Continue sliding scale insulin, CBGs currently controlled    Arthritis - Pain controlled with low-dose IV morphine, Tylenol  Code Status:Full CODE STATUS  DVT Prophylaxis:  Lovenox  Family Communication: Discussed in detail with the patient, all imaging results, lab results explained to the patient's son, daughter-in-law  at the bedside  Disposition Plan: Per son, will not want to skilled nursing facility. They would like to have home health set up at home and she is ready to be discharged PT evaluation recommended skilled nursing facility  Time Spent in minutes   25 minutes  Procedures:  CT abd   Consultants:   Gen. surgery  Antimicrobials:   IV Rocephin   Medications  Scheduled Meds: . cefTRIAXone (ROCEPHIN)  IV  1 g Intravenous Q24H  . enoxaparin (LOVENOX) injection  40 mg Subcutaneous Q24H  . furosemide  20 mg Oral Once  . insulin aspart  0-15 Units Subcutaneous TID WC  . insulin aspart  0-5 Units Subcutaneous QHS  . lisinopril  5 mg Oral Daily  . sertraline  25 mg Oral Daily  . sodium chloride flush  3 mL Intravenous Q12H   Continuous Infusions:  PRN Meds:.acetaminophen **OR** acetaminophen, hydrALAZINE, Melatonin, morphine injection, RESOURCE THICKENUP CLEAR   Antibiotics   Anti-infectives    Start     Dose/Rate Route Frequency Ordered Stop   06/13/16 1800  cefTRIAXone (ROCEPHIN) 1 g in dextrose 5 % 50 mL IVPB     1 g 100 mL/hr over 30 Minutes Intravenous Every 24 hours 06/20/2016 1914     06/20/2016 1845  cefTRIAXone  (ROCEPHIN) 2 g in dextrose 5 % 50 mL IVPB  Status:  Discontinued     2 g 100 mL/hr over 30 Minutes Intravenous  Once 06/17/2016 1839 06/17/2016 1902   07/04/2016 1700  cefTRIAXone (ROCEPHIN) 1 g in dextrose 5 % 50 mL IVPB     1 g 100 mL/hr over 30 Minutes Intravenous  Once 07/05/2016 1647 06/22/2016 2001        Subjective:   Bennetta Redder was seen and examined today. Today complaining of shortness of breath, alert and awake and oriented, daughter-in-law at the bedside. No fevers or chills.  No nausea or vomiting. Denies any dizziness, chest pain, abdominal pain, new weakness, numbness or tingling. No overnight events.  Objective:   Vitals:   06/14/16 1344 06/14/16 2120 06/15/16 0557 06/15/16 0941  BP: (!) 143/60 (!) 164/69 (!) 160/72 (!) 161/72  Pulse: 71 80 85   Resp:  20 (!) 1   Temp: 97.7 F (36.5 C) 97.4 F (36.3 C) 97.5 F (36.4 C)   TempSrc: Oral  Oral   SpO2: 94% (!) 85% 97%   Weight:      Height:        Intake/Output Summary (Last 24 hours) at 06/15/16 1258 Last data filed at 06/15/16 4656  Gross per 24 hour  Intake            832.5 ml  Output                0 ml  Net            832.5 ml     Wt Readings from Last 3 Encounters:  06/28/2016 60.5 kg (133 lb 6.1 oz)     Exam  General:  Alert and oriented, 3, NAD  HEENT:    Neck: Supple, no JVD  Cardiovascular: S1 S2 clear, RRR  Respiratory:  Decreased breath sounds at the bases  Gastrointestinal: Soft, nontender, large hernia shifted to the right side of the abdomen   Ext: no cyanosis clubbing or edema  Neuro: no new deficits  Skin: No rashes  Psych:  alert and oriented 3, normal affect and demeanor   Data Reviewed:  I have personally reviewed following labs and imaging studies  Micro Results Recent Results (from the past 240 hour(s))  Urine culture     Status: Abnormal   Collection Time: 06/18/2016  3:49 PM  Result Value Ref Range Status   Specimen Description URINE, RANDOM  Final   Special  Requests NONE  Final   Culture MULTIPLE SPECIES PRESENT, SUGGEST RECOLLECTION (A)  Final   Report Status 06/14/2016 FINAL  Final  Culture, blood (x 2)     Status: None (Preliminary result)   Collection Time: 07/01/2016  7:09 PM  Result Value Ref Range Status   Specimen Description BLOOD RIGHT HAND  Final   Special Requests IN PEDIATRIC BOTTLE 2CC  Final   Culture NO GROWTH 2 DAYS  Final   Report Status PENDING  Incomplete  Culture, blood (x 2)     Status: None (Preliminary result)   Collection Time: 06/14/2016  7:20 PM  Result Value Ref Range Status   Specimen Description BLOOD LEFT HAND  Final   Special Requests BOTTLES DRAWN AEROBIC ONLY 5CC  Final   Culture NO GROWTH 2 DAYS  Final   Report Status PENDING  Incomplete    Radiology Reports Dg Chest 2 View  Result Date: 06/29/2016 CLINICAL DATA:  Weakness and SOB X 2 days. Patient family reports patient has a knot on her right side on the lower part of her chest. HX diabetes EXAM: CHEST  2 VIEW COMPARISON:  None. FINDINGS: Heart size is normal. Mildly prominent interstitial marking are present. No focal consolidations or pleural effusions. No pulmonary edema. IMPRESSION: No evidence for acute cardiopulmonary abnormality. Electronically Signed   By: Nolon Nations M.D.   On: 06/27/2016 15:15   Ct Abdomen Pelvis W Contrast  Result Date: 07/06/2016 CLINICAL DATA:  Worsening generalized weakness and abdominal pain. EXAM: CT ABDOMEN AND PELVIS WITH CONTRAST TECHNIQUE: Multidetector CT imaging of the abdomen and pelvis was performed using the standard protocol following bolus administration of intravenous contrast. CONTRAST:  133m ISOVUE-300 IOPAMIDOL (ISOVUE-300) INJECTION 61% COMPARISON:  None. FINDINGS: Lower chest: Mild airspace opacities in both lung bases could be atelectatic or infectious. No effusions. Hepatobiliary: No focal liver abnormality is seen. Status post cholecystectomy. No biliary dilatation. Pancreas: Unremarkable. No pancreatic  ductal dilatation or surrounding inflammatory changes. Spleen: Normal in size without focal abnormality. Adrenals/Urinary Tract: Both adrenals are normal. A few low-attenuation lesions are present about each kidney, measuring up to 3 cm. These likely represent simple cysts. No suspicious renal parenchymal lesion is evident. Collecting systems and ureters are unremarkable. Urinary bladder is displaced anteriorly by the distended rectum but otherwise unremarkable. Stomach/Bowel: There is marked rectal distention with stool, measuring up to 10 cm transverse. Stomach is unremarkable. Most of the small bowel and colon reside within a very large right lateral abdominal hernia. No evidence of obstruction within the hernia. No extraluminal gas. No focal inflammation of bowel. Vascular/Lymphatic: The abdominal aorta is normal in caliber with mild atherosclerotic calcification. No adenopathy in the abdomen or pelvis. Reproductive: Multiple uterine calcifications.  No adnexal masses. Other: No ascites. Musculoskeletal: No significant skeletal lesion. Moderately severe facet arthritis from L4 through S1. Very large right lateral abdominal hernia. Adjacent smaller anterolateral hernia. IMPRESSION: 1. Most of the small bowel and colon reside within a large right lateral abdominal wall hernia. Smaller adjacent right anterolateral hernia contains a portion of the cecum and ileocecal valve. No obstruction, perforation or acute inflammation of bowel within the hernia. 2. Marked rectal distention with stool, likely fecal impaction. The distended rectum displaces the urinary bladder anteriorly. 3. Mild lung base opacities bilaterally, atelectasis versus infection. 4. Low-attenuation renal lesions, probably benign cysts. 5. Severe lower lumbar facet arthritis. Electronically Signed   By: DAndreas NewportM.D.   On: 07/03/2016 20:35   Dg Abd Acute W/chest  Result Date: 06/15/2016 CLINICAL DATA:  Constipation issues from a hernia.  Onset of shortness of breath this morning. History of diabetes,  sepsis secondary urinary tract infection. EXAM: DG ABDOMEN ACUTE W/ 1V CHEST COMPARISON:  Chest x-ray of February 3rd 2018. FINDINGS: The lungs are adequately inflated. Worsening of left lower lobe atelectasis or pneumonia has developed. There is a small left pleural effusion. The heart is top-normal in size. The pulmonary vascularity is normal. There is calcification in the wall of the aortic arch. Within the abdomen there is moderate gaseous distention of the stomach. There is stool and gas within the colon. Small amounts of small bowel gas are present. A right abdominal hernia is present. An IUD is present. There degenerative changes of the lower lumbar spine. IMPRESSION: No evidence of bowel obstruction at this time. Worsening of left lower lobe atelectasis or pneumonia with small left pleural effusion. Thoracic aortic atherosclerosis. Electronically Signed   By: David  Martinique M.D.   On: 06/15/2016 09:21    Lab Data:  CBC:  Recent Labs Lab 06/13/2016 1623 06/26/2016 1907 06/13/16 0240 06/14/16 0500 06/15/16 0712  WBC 7.8 8.0 9.0 8.3 6.8  NEUTROABS 5.7  --   --   --   --   HGB 16.4* 14.4 14.9 14.0 14.6  HCT 47.0* 45.0 47.2* 44.7 44.8  MCV 93.1 93.6 95.4 95.5 93.5  PLT 221 209 183 218 935   Basic Metabolic Panel:  Recent Labs Lab 06/19/2016 1623 06/26/2016 1746 07/02/2016 1907 06/13/16 0240 06/14/16 0500 06/15/16 0712  NA 138  --   --  139 135 137  K 4.3  --   --  4.0 5.0 4.0  CL 93*  --   --  95* 88* 94*  CO2 34*  --   --  38* 39* 32  GLUCOSE 173*  --   --  173* 149* 100*  BUN 15  --   --  _0 CREATININE 0.42* 0.39* 0.38* 0.39* 0.37* <0.30*  CALCIUM 9.1  --   --  8.9 8.5* 7.5*   GFR: CrCl cannot be calculated (This lab value cannot be used to calculate CrCl because it is not a number: <0.30). Liver Function Tests:  Recent Labs Lab 06/24/2016 1623 06/13/16 0240  AST 18 17  ALT QUANTITY NOT SUFFICIENT, UNABLE  TO PERFORM TEST 15  ALKPHOS 50 48  BILITOT QUANTITY NOT SUFFICIENT, UNABLE TO PERFORM TEST 0.4  PROT 6.5 6.2*  ALBUMIN 3.5 3.2*   No results for input(s): LIPASE, AMYLASE in the last 168 hours. No results for input(s): AMMONIA in the last 168 hours. Coagulation Profile: No results for input(s): INR, PROTIME in the last 168 hours. Cardiac Enzymes:  Recent Labs Lab 06/30/2016 1623  TROPONINI 0.03*   BNP (last 3 results) No results for input(s): PROBNP in the last 8760 hours. HbA1C:  Recent Labs  07/07/2016 1907  HGBA1C 7.9*   CBG:  Recent Labs Lab 06/14/16 1220 06/14/16 1802 06/14/16 2118 06/15/16 0807 06/15/16 1151  GLUCAP 158* 136* 108* 107* 172*   Lipid Profile: No results for input(s): CHOL, HDL, LDLCALC, TRIG, CHOLHDL, LDLDIRECT in the last 72 hours. Thyroid Function Tests: No results for input(s): TSH, T4TOTAL, FREET4, T3FREE, THYROIDAB in the last 72 hours. Anemia Panel: No results for input(s): VITAMINB12, FOLATE, FERRITIN, TIBC, IRON, RETICCTPCT in the last 72 hours. Urine analysis:    Component Value Date/Time   COLORURINE AMBER (A) 07/04/2016 1549   APPEARANCEUR CLOUDY (A) 06/14/2016 1549   LABSPEC 1.017 06/13/2016 1549   PHURINE 5.0 06/28/2016 1549   GLUCOSEU NEGATIVE 06/11/2016 1549   HGBUR MODERATE (A) 06/17/2016 1549  BILIRUBINUR NEGATIVE 06/27/2016 [place] 06/20/2016 1549   PROTEINUR NEGATIVE 06/10/2016 1549   NITRITE NEGATIVE 07/06/2016 1549   LEUKOCYTESUR LARGE (A) 06/26/2016 1549     RAI,RIPUDEEP M.D. [facility] 06/15/2016, 12:58 PM  Pager: [phone] Between 7am to 7pm - call Pager - [phone]  After 7pm go to [email or web address] - password [email or web address]  Call night coverage person covering after 7pm

## 2016-06-15 NOTE — Progress Notes (Signed)
Pharmacy Antibiotic Note  Lindsey JenkinsViolet Garza is a 81 y.o. female admitted on 06/15/2016 with pneumonia.  Pharmacy has been consulted for vancomycin and zosyn dosing.  Plan: Vancomycin 750mg  IV every 12 hours.  Goal trough 15-20 mcg/mL. Zosyn 3.375g IV q8h (4 hour infusion).  Monitor culture data, renal function and clinical course VT at SS prn  Height: 5\' 2"  (157.5 cm) (per pt) Weight: 133 lb 6.1 oz (60.5 kg) IBW/kg (Calculated) : 50.1  Temp (24hrs), Avg:97.2 F (36.2 C), Min:95.8 F (35.4 C), Max:98.2 F (36.8 C)   Recent Labs Lab 2017/02/07 1616  2017/02/07 1623 2017/02/07 1746 2017/02/07 1907 2017/02/07 1925 2017/02/07 2230 06/13/16 0239 06/13/16 0240 06/14/16 0500 06/15/16 0712  WBC  --   --  7.8  --  8.0  --   --   --  9.0 8.3 6.8  CREATININE  --   < > 0.42* 0.39* 0.38*  --   --   --  0.39* 0.37* <0.30*  LATICACIDVEN 1.57  --   --   --   --  0.72 0.8 1.1  --   --   --   < > = values in this interval not displayed.  CrCl cannot be calculated (This lab value cannot be used to calculate CrCl because it is not a number: <0.30).    No Known Allergies   Lindsey Garza, PharmD, BCPS Clinical Pharmacist [phone] 06/15/2016 9:00 PM

## 2016-06-15 NOTE — Progress Notes (Signed)
PT Cancellation Note  Patient Details Name: Lindsey Garza MRN: [id number] DOB: 04/28/1936   Cancelled Treatment:    Reason Eval/Treat Not Completed: Other (comment) ( admission nurse present interviewing patient will return later this pm.  )   Kimberleigh Mehan Artis DelayJ Nayvie Lips 06/15/2016, 3:24 PM Joycelyn RuaAimee Mutasim Tuckey, PTA pager [phone]

## 2016-06-15 NOTE — Progress Notes (Addendum)
Modified Barium Swallow Progress Note  Patient Details  Name: Ephriam JenkinsViolet Kight MRN: [id number] Date of Birth: 01/05/1936  Today's Date: 06/15/2016  Modified Barium Swallow completed.  Full report located under Chart Review in the Imaging Section.  Brief recommendations include the following:  Clinical Impression  Ms. Guiffre, admitted and diagnosed with UTI exhibited a neurologically impaired, mod-severe sensorimotor pharyngeal dysphagia of unclear etiology. Pt's granddaughter reported left side weakness approximately one year ago. Mild oral dysphagia marked by decreased labial seal with anterior spill. Multiple instances of silent penetration present during all liquid consistencies (thin, nectar, and honey). She was unable to produce effective throat clear/coughs and barium remained on vocal cords throughout study. Verbal cues for second swallow did not significantly clear moderate vallecular residue. No penetration with pureed solids. Postural techniques/strategies were not utilized due to unlikely compliance/carryover given pt's mentation and confusion. Esophagus scanned revealing what appeared to be mild-moderate stasis with questionable retrograde movement. Pt's aspiration risk is high at present and recommend Dys 2 solids, pudding thick liquids, meds crushed in puree and free water protocol (allowed thin water ONLY follwing oral care, not during meals or meds). Educated pt, granddaughter and RN re: swallow functioning, diet recommendations and free water protocol (no straws with water). ST will f/u for treatment to assess safety/efficiency of swallow and for possible diet upgrade.   Swallow Evaluation Recommendations       SLP Diet Recommendations: Dysphagia 2 (Fine chop) solids;Free water protocol after oral care;Pudding thick liquid   Liquid Administration via: No straw (no liquids except free water protocol)   Medication Administration: Crushed with puree   Supervision: Full  supervision/cueing for compensatory strategies;Patient able to self feed   Compensations: Slow rate;Small sips/bites;Clear throat intermittently   Postural Changes: Seated upright at 90 degrees   Oral Care Recommendations: Oral care QID        Royce MacadamiaLitaker, Natascha Edmonds Willis 06/15/2016,1:00 PM   Breck CoonsLisa Willis Plain DealingLitaker M.Ed [employer]-SLP Pager [phone]

## 2016-06-15 NOTE — Progress Notes (Signed)
Patient had 3 runs of Vtach a 6 beat, 5 beat and 3 beat run. Sitting in chair asymptomatic. Dr. Isidoro Donningai paged to make aware.

## 2016-06-15 NOTE — Progress Notes (Signed)
Attempted to see pt x2 today. Pt at test and once returned was too fatigued to participate.  Will return as schedule allows.  Tory EmeraldHolly Jeaninne [place], [place] [phone]

## 2016-06-15 NOTE — Progress Notes (Signed)
Physical Therapy Treatment Patient Details Name: Lindsey Garza Brigante MRN: [id number] DOB: 03/17/1936 Today's Date: 06/15/2016    History of Present Illness  Lindsey Garza Nusser is a 81 y.o. female with medical history significant of DM, arthritis, abd hernia who presents with worsening generalized weakness and polyuria over the past  2-3 days prior to hospital admission. Patient lives with her son, who later brought pt to the ED as weakness continued to worsen. Patient does report some generalized abd pain.     PT Comments    Pt in bed on arrival saturated in urine.  Son reports patient not on O2 prior to admission.  Removed O2 and rolled for nurse assessment of bottom.  Pt desaturated to 76% and required application of 2L followed by 4L of O2 via nasal cannula to maintain sats at 98% during activity.  Informed nursing and reduced O2 back to 1L [place] post treatment. 1L [place] 92% at rest.  Pt will continue to benefit from skilled placement post d/c to improve strength before returning home.   Follow Up Recommendations  SNF;Supervision/Assistance - 24 hour     Equipment Recommendations  None recommended by PT    Recommendations for Other Services       Precautions / Restrictions Precautions Precautions: Fall Restrictions Weight Bearing Restrictions: No    Mobility  Bed Mobility Overal bed mobility: Needs Assistance Bed Mobility: Rolling;Sidelying to Sit Rolling: Min assist;+2 for physical assistance Sidelying to sit: +2 for physical assistance;Mod assist       General bed mobility comments: Assist for LE advancement to edge of bed and assist to elevate trunk into sitting.    Transfers Overall transfer level: Needs assistance Equipment used: Rolling walker (2 wheeled);None;2 person hand held assist (x1 with RW patient unable to use correctly and opted next transfer for +2 HHA.  ) Transfers: Sit to/from [employer] CorporationStand;Stand Pivot Transfers Sit to Stand: Mod assist;+2 physical assistance          General transfer comment: Cues for hand placement, assist to forward weight shift and boost into standing.  Pt able to tolerate standing x 4 min before requiring seated rest break.  Pt performed stand pivot x2 trials.  Required 4L during activity.    Ambulation/Gait                 Stairs            Wheelchair Mobility    Modified Rankin (Stroke Patients Only)       Balance Overall balance assessment: Needs assistance;History of Falls Sitting-balance support: Bilateral upper extremity supported Sitting balance-Leahy Scale: Fair       Standing balance-Leahy Scale: Poor                      Cognition Arousal/Alertness: Awake/alert Behavior During Therapy: Anxious Overall Cognitive Status: History of cognitive impairments - at baseline                      Exercises      General Comments        Pertinent Vitals/Pain Pain Assessment: No/denies pain    Home Living Family/patient expects to be discharged to:: Unsure Living Arrangements: Other relatives                  Prior Function            PT Goals (current goals can now be found in the care plan section) Acute Rehab PT Goals Patient Stated Goal:  go home Potential to Achieve Goals: Fair Progress towards PT goals: Progressing toward goals    Frequency    Min 3X/week      PT Plan Current plan remains appropriate    Co-evaluation             End of Session   Activity Tolerance: Patient tolerated treatment well Patient left: with call bell/phone within reach;with bed alarm set;with family/visitor present;in chair     Time: 1610-9604 PT Time Calculation (min) (ACUTE ONLY): 25 min  Charges:  $Therapeutic Activity: 23-37 mins                    G Codes:      Florestine Avers Jul 02, 2016, 5:04 PM Joycelyn Rua, PTA pager [phone]

## 2016-06-15 NOTE — Progress Notes (Addendum)
Shift event: RN paged because pt was unresponsive. RRRN in room and NP spoke to her. Pt is unresponsive and only opens eyes for brief moment to sternal rub. No purposeful movements of extremities. Non verbal. Not following commands. Very shallow respirations. NP to bedside. S: pt can not participate in ROS due to unresponsiveness. Per grandson (POA), pt ate dinner and became less and less responsive over the hour around shift change. Completely unresponsive by approximately 1930 hrs.  O: Frail, very toxic appearing elderly WF in no distress. Opens eyes briefly with sternal rub but no purposeful movements or reaction to pain and later, did not respond at all. Non verbal. Not following commands. PERRL at 2mm. Rectal temp 95.31F. Card: RRR with HR 80s. BP 130s. RR 10 with shallow breathing. Almost no air exchange on auscultation. Little to no gag reflex. Skin is pale, warm, dry. CBG 161.  A/P: 1. Acute unresponsiveness with unknown preceding event. ABG with pH 7, PCO2 high and unregistered on scale. PO2 low. Bicarb 47. ?Aspiration. ? MI. ? Sepsis. CXR with suspected PNA. Can not use bipap secondary to unresponsiveness and little to no gag.  Action: lab orders placed. Broad spectrum abx ordered. Zole pads and crash cart in room as pt was full code at that time.  NP had a long conversation with grandson who claims to be POA. He is the only caregiver in his grandmother's life. He says the rest of the family is basically estranged and has "never done anything to help her". NP explained that the direct cause of this change is unknown. Although, after the ABG, hypercarbia is the likely reason for pt's unresponsiveness. Discussed grim prognosis and code status. Grandson states there is no living will, but he does not want his grandmother to suffer. He also states that his aunt was on life support and that his grandmother would not want that. After the ABG resulted, NP stressed that pt would need to be an urgent  intubation, but this was discouraged in a pt of her age with her acute and chronic issues. NP informed grandson that pt would likely never be able to survive off a ventilator. Grandson agreed to DNR/DNI. Multiple conversations with grandson over a 2 hour period answering his questions and trying to reassure him. He is very upset because his grandmother is basically the only family he has left (he is estranged from remainder of family). He told this NP about his family and everything his grandmother has been through and it is a sad story. Grandson declined Orthoptistchaplain.  At this time, the previous orders for abx, labs, blood cultures etc have been discontinued as had discussion with grandson that care would be futile at this point without tx of her hypercarbia. He agrees. Grandson's wife states "we would just be delaying the inevitable". Suspect death will occur within hours. Pt appears comfortable. MSO4 on board in case of respiratory distress or pain. Discussed issues with Dr. Ophelia CharterYates of [facility] who agrees with decision making.  KJKG, NP [facility]  Update: Back to check on pt and family. Allowed grandson to vent further. Pt remains unresponsive with shallow,  infrequent respirations. Supportive care given to family.  KJKG, NP [facility] Update: Grandson had NP paged because pt's daughter and niece [place] from [place]. Grandson wanted NP to explain situation to family. Reviewed situation and repeated conversations as above. Both family members here now agree that pt would not have wanted to be intubated or be on life support. Reassurance given in light  of there is nothing else we can do at this point.  KJKG, NP [facility] Discussed tonight's events with Dr. Isidoro Donning this am.  Hortencia Conradi, NP [facility]

## 2016-06-16 DIAGNOSIS — Z7189 Other specified counseling: Secondary | ICD-10-CM

## 2016-06-16 DIAGNOSIS — Z515 Encounter for palliative care: Secondary | ICD-10-CM

## 2016-06-16 MED ORDER — HALOPERIDOL LACTATE 5 MG/ML IJ SOLN: 1.0000 mg | Freq: Four times a day (QID) | INTRAMUSCULAR | Status: DC | PRN

## 2016-06-16 MED ORDER — GLYCOPYRROLATE 0.2 MG/ML IJ SOLN: 0.2000 mg | INTRAMUSCULAR | Status: DC | PRN

## 2016-06-16 MED ORDER — BIOTENE DRY MOUTH MT LIQD: 15.0000 mL | OROMUCOSAL | Status: DC | PRN

## 2016-06-16 MED ORDER — MORPHINE SULFATE (PF) 2 MG/ML IV SOLN: 1.0000 mg | INTRAVENOUS | Status: DC | PRN

## 2016-06-16 MED ORDER — POLYVINYL ALCOHOL 1.4 % OP SOLN
1.0000 [drp] | Freq: Four times a day (QID) | OPHTHALMIC | Status: DC | PRN
Start: 2016-06-16 — End: 2016-06-17
  Filled 2016-06-16: qty 15

## 2016-06-17 LAB — CULTURE, BLOOD (ROUTINE X 2)
CULTURE: NO GROWTH
Culture: NO GROWTH

## 2016-07-06 DIAGNOSIS — Z7189 Other specified counseling: Secondary | ICD-10-CM

## 2016-07-06 DIAGNOSIS — Z515 Encounter for palliative care: Secondary | ICD-10-CM

## 2016-07-08 NOTE — Progress Notes (Signed)
[facility]                                                                              Patient Demographics  Lindsey Garza, is a 81 y.o. female, DOB - 1935-10-25, [place] date - 07/07/2016   Admitting Physician Donne Hazel, MD  Outpatient Primary MD for the patient is PROVIDER NOT IN SYSTEM  Outpatient specialists:   LOS - 4  days    Chief Complaint  Patient presents with  . Weakness       Brief summary   Patient is a 81 year old female with diabetes, arthritis, abdominal hernia presented with worsening generalized weakness, polyuria over the last 2-3 days. Patient also reported generalized abdominal pain, shortness of breath. She also reported soft mass over the right upper quadrant. Initially denied a tenderness however noted to be grimacing on palpation. She was found to have temp of 35F, tachypnea and UA suggestive of UTI.   Assessment & Plan    Principal Problem: Unresponsive episode/acute encephalopathy with acute hypercarbic respiratory failure - Overnight events noted, possibly may have had aspirated. Other differentials could be pulmonary embolism, MI etc. Per rapid response documentation, [name]'s note and my discussion with the patient's grandson today, patient was alert and well until after dinner last night she started having shallow respirations and not following commands. She had rapid decline after that. ABG showed pH of 7.1, PCO2 above reportable range, PO2 216, bicarbonate 47 -  Patient's grandson reported to me that she had never wanted any artificial ventilation/life-support or heroic measures, hence overnight code status was changed to DNR.  - This morning patient is completely unresponsive, not following any commands. I consulted palliative medicine for goals of care and assistance with end-of-life symptoms. Patient's other family members are now also present who were otherwise estranged complicating the family dynamics.     Urinary tract infection - Patient met SIRS criteria at the time of admission with temp of 35F, tachypnea RR 27 and UA suggestive of UTI - Blood cultures negative so far, urine culture showed multiple species     Abdominal hernia with abdominal pain - CT abdomen and pelvis reviewed, most of the small bowel and colon resides in the large right lateral abdominal wall hernia, small area just and right anterolateral hernia contains a portion of the cecum and ileocecal valve, no obstruction perforation or acute inflammation of the bowel within the hernia. Had abdominal hernia surgery 15 years ago, per son would not want another surgery at this time - Gen. surgery consulted, recommended no indication for urgent repair. If she develops pain or desires repair she will need to be referred to hernia specialist in tertiary setting.  Fecal impaction with rectal distention - Resolved per son at the bedside had multiple bowel movements  -  will place on bowel regiment upon DC - repeat abdominal x-ray 2/6showed  no obstruction, stool blood and has improved   Diabetes mellitus (HCC) -Sliding-scale insulin    Arthritis -  low-dose IV morphine, Tylenol  Code Status: DNR/DNI DVT Prophylaxis:  Lovenox  Family Communication: Discussed in detail with the patient, all imaging  results, lab results explained to the patient's grandson, multiple other family members at the bedside   Disposition Plan: Poor prognosis  Time Spent in minutes   25 minutes  Procedures:  CT abd   Consultants:   Gen. surgery  Antimicrobials:   IV Rocephin   Medications  Scheduled Meds:  Continuous Infusions:  PRN Meds:.antiseptic oral rinse, glycopyrrolate, haloperidol lactate, morphine injection, polyvinyl alcohol   Antibiotics   Anti-infectives    Start     Dose/Rate Route Frequency Ordered Stop   06/15/16 2130  vancomycin (VANCOCIN) IVPB 750 mg/150 ml premix  Status:  Discontinued     750 mg 150 mL/hr over  60 Minutes Intravenous Every 12 hours 06/15/16 2102 06/15/16 2112   06/15/16 2130  piperacillin-tazobactam (ZOSYN) IVPB 3.375 g  Status:  Discontinued     3.375 g 12.5 mL/hr over 240 Minutes Intravenous Every 8 hours 06/15/16 2102 06/15/16 2112   06/13/16 1800  cefTRIAXone (ROCEPHIN) 1 g in dextrose 5 % 50 mL IVPB  Status:  Discontinued     1 g 100 mL/hr over 30 Minutes Intravenous Every 24 hours 07/01/2016 1914 06/15/16 2103   06/15/2016 1845  cefTRIAXone (ROCEPHIN) 2 g in dextrose 5 % 50 mL IVPB  Status:  Discontinued     2 g 100 mL/hr over 30 Minutes Intravenous  Once 07/07/2016 1839 06/17/2016 1902   06/15/2016 1700  cefTRIAXone (ROCEPHIN) 1 g in dextrose 5 % 50 mL IVPB     1 g 100 mL/hr over 30 Minutes Intravenous  Once 06/18/2016 1647 06/10/2016 2001        Subjective:   Lindsey Garza was seen and examined today.Overnight events noted, currently unresponsive, shallow breathing.   Objective:   Vitals:   06/15/16 1345 06/15/16 2020 06/15/16 2050 2016/06/27 0650  BP: 140/80 132/63 (!) 149/76 (!) 143/66  Pulse: 72 83 86 86  Resp:  _0 Temp: 98.2 F (36.8 C) (!) 95.8 F (35.4 C)  (!) 96 F (35.6 C)  TempSrc: Oral Rectal  Axillary  SpO2:  100% 100% 100%  Weight:      Height:        Intake/Output Summary (Last 24 hours) at 06/27/2016 1219 Last data filed at 2016-06-27 0522  Gross per 24 hour  Intake               50 ml  Output                0 ml  Net               50 ml     Wt Readings from Last 3 Encounters:  06/10/2016 60.5 kg (133 lb 6.1 oz)     Exam  General:  Unresponsive, not following any commands  HEENT:    Neck: Supple, no JVD  Cardiovascular: S1 S2 clear, RRR  Respiratory:  Decreased breath sounds at the bases  Gastrointestinal: Soft, nontender, large hernia shifted to the right side of the abdomen   Ext: no cyanosis clubbing or edema  Neuro: no new deficits  Skin: No rashes  Psych:  unresponsive, not following any commands   Data Reviewed:  I  have personally reviewed following labs and imaging studies  Micro Results Recent Results (from the past 240 hour(s))  Urine culture     Status: Abnormal   Collection Time: 06/19/2016  3:49 PM  Result Value Ref Range Status   Specimen Description URINE, RANDOM  Final   Special Requests NONE  Final   Culture MULTIPLE SPECIES PRESENT, SUGGEST RECOLLECTION (A)  Final   Report Status 06/14/2016 FINAL  Final  Culture, blood (x 2)     Status: None (Preliminary result)   Collection Time: 06/19/2016  7:09 PM  Result Value Ref Range Status   Specimen Description BLOOD RIGHT HAND  Final   Special Requests IN PEDIATRIC BOTTLE 2CC  Final   Culture NO GROWTH 3 DAYS  Final   Report Status PENDING  Incomplete  Culture, blood (x 2)     Status: None (Preliminary result)   Collection Time: 06/17/2016  7:20 PM  Result Value Ref Range Status   Specimen Description BLOOD LEFT HAND  Final   Special Requests BOTTLES DRAWN AEROBIC ONLY 5CC  Final   Culture NO GROWTH 3 DAYS  Final   Report Status PENDING  Incomplete    Radiology Reports Dg Chest 2 View  Result Date: 06/27/2016 CLINICAL DATA:  Weakness and SOB X 2 days. Patient family reports patient has a knot on her right side on the lower part of her chest. HX diabetes EXAM: CHEST  2 VIEW COMPARISON:  None. FINDINGS: Heart size is normal. Mildly prominent interstitial marking are present. No focal consolidations or pleural effusions. No pulmonary edema. IMPRESSION: No evidence for acute cardiopulmonary abnormality. Electronically Signed   By: Nolon Nations M.D.   On: 06/13/2016 15:15   Ct Abdomen Pelvis W Contrast  Result Date: 06/13/2016 CLINICAL DATA:  Worsening generalized weakness and abdominal pain. EXAM: CT ABDOMEN AND PELVIS WITH CONTRAST TECHNIQUE: Multidetector CT imaging of the abdomen and pelvis was performed using the standard protocol following bolus administration of intravenous contrast. CONTRAST:  12m ISOVUE-300 IOPAMIDOL (ISOVUE-300)  INJECTION 61% COMPARISON:  None. FINDINGS: Lower chest: Mild airspace opacities in both lung bases could be atelectatic or infectious. No effusions. Hepatobiliary: No focal liver abnormality is seen. Status post cholecystectomy. No biliary dilatation. Pancreas: Unremarkable. No pancreatic ductal dilatation or surrounding inflammatory changes. Spleen: Normal in size without focal abnormality. Adrenals/Urinary Tract: Both adrenals are normal. A few low-attenuation lesions are present about each kidney, measuring up to 3 cm. These likely represent simple cysts. No suspicious renal parenchymal lesion is evident. Collecting systems and ureters are unremarkable. Urinary bladder is displaced anteriorly by the distended rectum but otherwise unremarkable. Stomach/Bowel: There is marked rectal distention with stool, measuring up to 10 cm transverse. Stomach is unremarkable. Most of the small bowel and colon reside within a very large right lateral abdominal hernia. No evidence of obstruction within the hernia. No extraluminal gas. No focal inflammation of bowel. Vascular/Lymphatic: The abdominal aorta is normal in caliber with mild atherosclerotic calcification. No adenopathy in the abdomen or pelvis. Reproductive: Multiple uterine calcifications.  No adnexal masses. Other: No ascites. Musculoskeletal: No significant skeletal lesion. Moderately severe facet arthritis from L4 through S1. Very large right lateral abdominal hernia. Adjacent smaller anterolateral hernia. IMPRESSION: 1. Most of the small bowel and colon reside within a large right lateral abdominal wall hernia. Smaller adjacent right anterolateral hernia contains a portion of the cecum and ileocecal valve. No obstruction, perforation or acute inflammation of bowel within the hernia. 2. Marked rectal distention with stool, likely fecal impaction. The distended rectum displaces the urinary bladder anteriorly. 3. Mild lung base opacities bilaterally, atelectasis  versus infection. 4. Low-attenuation renal lesions, probably benign cysts. 5. Severe lower lumbar facet arthritis. Electronically Signed   By: DAndreas NewportM.D.   On: 07/04/2016 20:35   [facility]  [facility]  Date: 06/15/2016 CLINICAL DATA:  Unresponsive. EXAM: PORTABLE CHEST 1 VIEW COMPARISON:  06/15/2016 at 0859 hours FINDINGS: The cardiomediastinal silhouette is unchanged. There is increasing airspace opacity in the left perihilar region and left lung base. Mild right basilar opacity is also present. No sizable pleural effusion or pneumothorax is identified. IMPRESSION: 1. Worsening left lower lobe airspace disease concerning for pneumonia. 2. Mild right basilar atelectasis or pneumonia. Electronically Signed   By: Logan Bores M.D.   On: 06/15/2016 21:04   Dg Abd Acute W/chest  Result Date: 06/15/2016 CLINICAL DATA:  Constipation issues from a hernia. Onset of shortness of breath this morning. History of diabetes, sepsis secondary urinary tract infection. EXAM: DG ABDOMEN ACUTE W/ 1V CHEST COMPARISON:  Chest x-ray of February 3rd 2018. FINDINGS: The lungs are adequately inflated. Worsening of left lower lobe atelectasis or pneumonia has developed. There is a small left pleural effusion. The heart is top-normal in size. The pulmonary vascularity is normal. There is calcification in the wall of the aortic arch. Within the abdomen there is moderate gaseous distention of the stomach. There is stool and gas within the colon. Small amounts of small bowel gas are present. A right abdominal hernia is present. An IUD is present. There degenerative changes of the lower lumbar spine. IMPRESSION: No evidence of bowel obstruction at this time. Worsening of left lower lobe atelectasis or pneumonia with small left pleural effusion. Thoracic aortic atherosclerosis. Electronically Signed   By: David  Martinique M.D.   On: 06/15/2016 09:21   Dg Swallowing Func-speech Pathology  Result Date: 06/15/2016 Objective  Swallowing Evaluation: Type of Study: MBS-Modified Barium Swallow Study Patient Details Name: Kyriaki Moder MRN: [id number] Date of Birth: [birth year] Today's Date: 06/15/2016 Time: SLP Start Time (ACUTE ONLY): 1102-SLP Stop Time (ACUTE ONLY): 1122 SLP Time Calculation (min) (ACUTE ONLY): 20 min Past Medical History: Past Medical History: Diagnosis Date . Arthritis  . Diabetes mellitus without complication [facility])  Past Surgical History: Past Surgical History: Procedure Laterality Date . CHOLECYSTECTOMY   . HERNIA REPAIR   HPI: 81 y.o. female with medical history significant of DM, arthritis, abd hernia who presents with worsening generalized weakness and polyuria over the past  2-3 days prior to hospital admission. Patient lives with her son, who later brought pt to the ED as weakness continued to worsen. Patient does report some generalized abd pain. Reports some sob. No coughing, but does report some runny nose. Patient reports last UTI was "many years ago." Is managing DM through diet primarily. Patient also reports soft mass over RUQ that has been present over the past several months. Initially denied tenderness, however pt noted to grimace on palpation over lesion in ED. No Data Recorded Assessment / Plan / Recommendation CHL IP CLINICAL IMPRESSIONS 06/15/2016 Therapy Diagnosis Moderate pharyngeal phase dysphagia;Severe pharyngeal phase dysphagia Clinical Impression Ms. Gumbs, admitted and diagnosed with UTI exhibited a neurologically impaired, mod-severe sensorimotor pharyngeal dysphagia of unclear etiology. Pt's granddaughter reported left side weakness approximately one year ago. Multiple instances of silent penetration present during all liquid consistencies (thin, nectar, and honey). She was unable to produce effective throat clear/coughs and barium remained on vocal cords throughout study. Verbal cues for second swallow did not significantly clear moderate vallecular residue. No penetration with pureed solids.  Postural techniques/strategies were not utilized due to unlikely compliance/carryover given pt's mentation and confusion. Pt's aspiration risk is high at present and recommend Dys 2 solids, pudding thick liquids, meds crushed in puree and free water protocol (allowed thin  water ONLY follwing oral care, not during meals or meds). Educated pt, granddaughter and RN re: swallow functioning, diet recommendations and free water protocol (no straws with water). ST will f/u for treatment to assess safety/efficiency of swallow and for possible diet upgrade. Impact on safety and function Moderate aspiration risk;Severe aspiration risk   CHL IP TREATMENT RECOMMENDATION 06/15/2016 Treatment Recommendations Therapy as outlined in treatment plan below   Prognosis 06/15/2016 Prognosis for Safe Diet Advancement Fair Barriers to Reach Goals Severity of deficits;Cognitive deficits Barriers/Prognosis Comment -- CHL IP DIET RECOMMENDATION 06/15/2016 SLP Diet Recommendations Dysphagia 2 (Fine chop) solids;Free water protocol after oral care;Pudding thick liquid Liquid Administration via No straw Medication Administration Crushed with puree Compensations Slow rate;Small sips/bites;Clear throat intermittently Postural Changes Seated upright at 90 degrees   CHL IP OTHER RECOMMENDATIONS 06/15/2016 Recommended Consults -- Oral Care Recommendations Oral care QID Other Recommendations --   CHL IP FOLLOW UP RECOMMENDATIONS 06/15/2016 Follow up Recommendations Other (comment)   CHL IP FREQUENCY AND DURATION 06/15/2016 Speech Therapy Frequency (ACUTE ONLY) min 2x/week Treatment Duration 2 weeks      CHL IP ORAL PHASE 06/15/2016 Oral Phase WFL Oral - Pudding Teaspoon -- Oral - Pudding Cup -- Oral - Honey Teaspoon -- Oral - Honey Cup -- Oral - Nectar Teaspoon -- Oral - Nectar Cup -- Oral - Nectar Straw -- Oral - Thin Teaspoon -- Oral - Thin Cup -- Oral - Thin Straw -- Oral - Puree -- Oral - Mech Soft -- Oral - Regular -- Oral - Multi-Consistency -- Oral - Pill  -- Oral Phase - Comment --  CHL IP PHARYNGEAL PHASE 06/15/2016 Pharyngeal Phase Impaired Pharyngeal- Pudding Teaspoon -- Pharyngeal -- Pharyngeal- Pudding Cup -- Pharyngeal -- Pharyngeal- Honey Teaspoon -- Pharyngeal -- Pharyngeal- Honey Cup Penetration/Aspiration during swallow;Pharyngeal residue - valleculae;Reduced tongue base retraction Pharyngeal Material enters airway, CONTACTS cords and not ejected out Pharyngeal- Nectar Teaspoon -- Pharyngeal -- Pharyngeal- Nectar Cup Delayed swallow initiation-vallecula;Pharyngeal residue - valleculae;Reduced tongue base retraction;Penetration/Aspiration during swallow Pharyngeal Material enters airway, CONTACTS cords and not ejected out Pharyngeal- Nectar Straw -- Pharyngeal -- Pharyngeal- Thin Teaspoon -- Pharyngeal -- Pharyngeal- Thin Cup Penetration/Aspiration during swallow;Pharyngeal residue - valleculae;Pharyngeal residue - pyriform;Reduced tongue base retraction;Delayed swallow initiation-pyriform sinuses;Reduced laryngeal elevation Pharyngeal Material enters airway, CONTACTS cords and not ejected out Pharyngeal- Thin Straw -- Pharyngeal -- Pharyngeal- Puree Pharyngeal residue - valleculae;Reduced tongue base retraction Pharyngeal -- Pharyngeal- Mechanical Soft -- Pharyngeal -- Pharyngeal- Regular -- Pharyngeal -- Pharyngeal- Multi-consistency -- Pharyngeal -- Pharyngeal- Pill -- Pharyngeal -- Pharyngeal Comment --  No flowsheet data found. CHL IP GO 06/14/2016 Functional Assessment Tool Used NOMS Functional Limitations Swallowing Swallow Current Status [phone]) CJ Swallow Goal Status (V4259) CI Swallow Discharge Status [phone]) (None) Motor Speech Current Status [phone]) (None) Motor Speech Goal Status [phone]) (None) Motor Speech Goal Status (A4166) (None) Spoken Language Comprehension Current Status (A6301) (None) Spoken Language Comprehension Goal Status (S0109) (None) Spoken Language Comprehension Discharge Status [phone]) (None) Spoken Language Expression Current  Status [phone]) (None) Spoken Language Expression Goal Status [phone]) (None) Spoken Language Expression Discharge Status [phone]) (None) Attention Current Status (S2831) (None) Attention Goal Status (D1761) (None) Attention Discharge Status [phone]) (None) Memory Current Status (T0626) (None) Memory Goal Status (R4854) (None) Memory Discharge Status (O2703) (None) Voice Current Status (J0093) (None) Voice Goal Status (G1829) (None) Voice Discharge Status (H3716) (None) Other Speech-Language Pathology Functional Limitation [phone]) (None) Other Speech-Language Pathology Functional Limitation Goal Status (F8101) (None) Other Speech-Language Pathology Functional Limitation Discharge Status [phone]) (None) Litaker,  Orbie Pyo 06/15/2016, 12:59 PM  Orbie Pyo Colvin Caroli.Ed CCC-SLP Pager [phone]              Lab Data:  CBC:  Recent Labs Lab 06/25/2016 1623 06/10/2016 1907 06/13/16 0240 06/14/16 0500 06/15/16 0712  WBC 7.8 8.0 9.0 8.3 6.8  NEUTROABS 5.7  --   --   --   --   HGB 16.4* 14.4 14.9 14.0 14.6  HCT 47.0* 45.0 47.2* 44.7 44.8  MCV 93.1 93.6 95.4 95.5 93.5  PLT 221 209 183 218 024   Basic Metabolic Panel:  Recent Labs Lab 06/25/2016 1623 06/11/2016 1746 06/28/2016 1907 06/13/16 0240 06/14/16 0500 06/15/16 0712  NA 138  --   --  139 135 137  K 4.3  --   --  4.0 5.0 4.0  CL 93*  --   --  95* 88* 94*  CO2 34*  --   --  38* 39* 32  GLUCOSE 173*  --   --  173* 149* 100*  BUN 15  --   --  _0 CREATININE 0.42* 0.39* 0.38* 0.39* 0.37* <0.30*  CALCIUM 9.1  --   --  8.9 8.5* 7.5*   GFR: CrCl cannot be calculated (This lab value cannot be used to calculate CrCl because it is not a number: <0.30). Liver Function Tests:  Recent Labs Lab 06/13/2016 1623 06/13/16 0240  AST 18 17  ALT QUANTITY NOT SUFFICIENT, UNABLE TO PERFORM TEST 15  ALKPHOS 50 48  BILITOT QUANTITY NOT SUFFICIENT, UNABLE TO PERFORM TEST 0.4  PROT 6.5 6.2*  ALBUMIN 3.5 3.2*   No results for input(s): LIPASE, AMYLASE in  the last 168 hours. No results for input(s): AMMONIA in the last 168 hours. Coagulation Profile: No results for input(s): INR, PROTIME in the last 168 hours. Cardiac Enzymes:  Recent Labs Lab 07/02/2016 1623  TROPONINI 0.03*   BNP (last 3 results) No results for input(s): PROBNP in the last 8760 hours. HbA1C: No results for input(s): HGBA1C in the last 72 hours. CBG:  Recent Labs Lab 06/15/16 0807 06/15/16 1151 06/15/16 1743 06/15/16 2013 06/15/16 2045  GLUCAP 107* 172* 147* 161* 176*   Lipid Profile: No results for input(s): CHOL, HDL, LDLCALC, TRIG, CHOLHDL, LDLDIRECT in the last 72 hours. Thyroid Function Tests: No results for input(s): TSH, T4TOTAL, FREET4, T3FREE, THYROIDAB in the last 72 hours. Anemia Panel: No results for input(s): VITAMINB12, FOLATE, FERRITIN, TIBC, IRON, RETICCTPCT in the last 72 hours. Urine analysis:    Component Value Date/Time   COLORURINE AMBER (A) 06/21/2016 1549   APPEARANCEUR CLOUDY (A) 06/14/2016 1549   LABSPEC 1.017 06/17/2016 1549   PHURINE 5.0 06/28/2016 1549   GLUCOSEU NEGATIVE 06/25/2016 1549   HGBUR MODERATE (A) 06/28/2016 1549   BILIRUBINUR NEGATIVE 06/29/2016 1549   KETONESUR NEGATIVE 06/22/2016 1549   PROTEINUR NEGATIVE 06/23/2016 1549   NITRITE NEGATIVE 06/28/2016 1549   LEUKOCYTESUR LARGE (A) 07/07/2016 1549     Hania Cerone M.D. [facility] 07-16-16, 12:19 PM  Pager: [phone] Between 7am to 7pm - call Pager - [phone]  After 7pm go to [email or web address] - password [email or web address]  Call night coverage person covering after 7pm

## 2016-07-08 NOTE — Progress Notes (Signed)
RN called by another RN in regards to pt passing. Upon entering room pt was pale and not breathing. Pt family was at bedside. Two RNs, Javaeh Muscatello B. and Charge RN GreenlandAsia J. listened for a heart beat and felt for a pulse and there was not one. Pt pronounced dead at 2157. Craige CottaKirby, NP paged and notified. All belongings sent home with family.

## 2016-07-08 NOTE — Progress Notes (Signed)
Pt condition was suddenly changed, according to pt grandson she ate almost all her dinner and was well responsive, around 8pm whiles I was with another pt, family called me to check up on pt and also wants to know her CXR result by then pt was in bed was unresponsive to pinch and rubbing of chest vital signs was reading within normal CBG was ok, I called rapid response and KIRBY THE NURSE PRACTITIONER about pt changed in condition all came around to review pt, put her on non breathable mask due her O2 dropping down, all ordered test were carried out, KIRBY spoke to family and family decided to make pt DNR, I will continue to monitor pt

## 2016-07-08 NOTE — Consult Note (Signed)
Consultation Note Date: 2016-07-01   Patient Name: Lindsey Garza  DOB: 1936-05-08  MRN: [id number]  Age / Sex: 81 y.o., female  PCP: Provider Not In System Referring Physician: Mendel Corning, MD  Reason for Consultation: Establishing goals of care s/t admission for UTI complicated by likely aspiration event resulting to rapid decline.   HPI/Patient Profile: 81 y.o. female  with past medical history of diabetes mellitus (controlled with diet), arthritis, abd hernia (per grandson this was repaired but she went back to work to soon) admitted on 06/29/2016 with weakness, abd pain r/t urosepsis.   Clinical Assessment and Goals of Care: I met today at Ms. Trebilcock's bedside with HCPOA grandson Nicole Kindred as well as multiple other family members. I explained to all that Ms. Laidler has continued to decline during this admission with likely aspiration overnight and now unresponsive with acute respiratory failure. Explained to all family that Ms. Segers is at EOL. Explained signs/symptoms of EOL. Explained full comfort care. Provided emotional support. Family agree for comfort care at this time per Ms. Nixon's wishes.   Discussed plans with Dr. Tana Coast.   Primary Decision Maker HCPOA grandson Joclynn Lumb    SUMMARY OF RECOMMENDATIONS   - Full comfort care  Code Status/Advance Care Planning:  DNR   Symptom Management:   Pain/dyspnea: Morphine 1-2 mg IV every hour prn.   Agitation: Haldol 1-2 mg IV every 6 hours prn.   Robinul prn.   Palliative Prophylaxis:   Frequent Pain Assessment, Oral Care and Turn Reposition  Additional Recommendations (Limitations, Scope, Preferences):  Full Comfort Care  Psycho-social/Spiritual:   Desire for further Chaplaincy support:yes  Additional Recommendations: Caregiving  Support/Resources and Grief/Bereavement Support  Prognosis:   Hours - Days  Discharge Planning:  Anticipated Hospital Death      Primary Diagnoses: Present on Admission: . UTI (urinary tract infection)   I have reviewed the medical record, interviewed the patient and family, and examined the patient. The following aspects are pertinent.  Past Medical History:  Diagnosis Date  . Arthritis   . Diabetes mellitus without complication ([place])   . Hypertension   . UTI (urinary tract infection) 06/2016   Social History   Social History  . Marital status: Widowed    Spouse name: N/A  . Number of children: N/A  . Years of education: N/A   Social History Main Topics  . Smoking status: Never Smoker  . Smokeless tobacco: Never Used  . Alcohol use No  . Drug use: No  . Sexual activity: No   Other Topics Concern  . None   Social History Narrative  . None   History reviewed. No pertinent family history. Scheduled Meds: Continuous Infusions: PRN Meds:.antiseptic oral rinse, glycopyrrolate, haloperidol lactate, morphine injection, polyvinyl alcohol No Known Allergies Review of Systems  Unable to perform ROS: Acuity of condition    Physical Exam  Constitutional: She appears well-developed.  HENT:  Head: Normocephalic and atraumatic.  Cardiovascular: Normal rate and regular rhythm.   Pulmonary/Chest: No accessory muscle usage. No  tachypnea. No respiratory distress. She has decreased breath sounds.  Very shallow, very little air movement  Abdominal: Soft.  Enlarged and shifted to left d/t hernia  Neurological: She is unresponsive.  Nursing note and vitals reviewed.   Vital Signs: BP (!) 143/66 (BP Location: Right Arm)   Pulse 86   Temp (!) 96 F (35.6 C) (Axillary)   Resp 15   Ht 5' 2"  (1.575 m) Comment: per pt  Wt 60.5 kg (133 lb 6.1 oz)   SpO2 100%   BMI 24.40 kg/m  Pain Assessment: Faces (pt makes a face when being turned) POSS *See Group Information*: 1-Acceptable,Awake and alert Pain Score: 1    SpO2: SpO2: 100 % O2 Device:SpO2: 100 % O2 Flow Rate:  .O2 Flow Rate (L/min): 2 L/min  IO: Intake/output summary:  Intake/Output Summary (Last 24 hours) at Jul 13, 2016 1114 Last data filed at 2016/07/13 0522  Gross per 24 hour  Intake               50 ml  Output                0 ml  Net               50 ml    LBM: Last BM Date: 06/14/16 Baseline Weight: Weight: 63.5 kg (140 lb) Most recent weight: Weight: 60.5 kg (133 lb 6.1 oz)     Palliative Assessment/Data:   Flowsheet Rows   Flowsheet Row Most Recent Value  Intake Tab  Referral Department  Hospitalist  Unit at Time of Referral  Med/Surg Unit  Palliative Care Primary Diagnosis  Other (Comment) [GI]  Date Notified  2016-07-13  Palliative Care Type  New Palliative care  Reason for referral  Clarify Goals of Care  Date of Admission  06/30/2016  # of days IP prior to Palliative referral  4  Clinical Assessment  Psychosocial & Spiritual Assessment  Palliative Care Outcomes      Time In: 5825 Time Out: 1125 Time Total: 51mn  Greater than 50%  of this time was spent counseling and coordinating care related to the above assessment and plan.  Signed by: AVinie Sill NP Palliative Medicine Team Pager # [phone](M-F 8a-5p) Team Phone # [phone](Nights/Weekends)

## 2016-07-08 NOTE — Discharge Summary (Addendum)
Expiration Note/ Death Summary  Lindsey Garza  MR#: [id number]  DOB:1936-02-12  Date of Admission: 23-Jun-2016 Date of Death: Jun 27, 2016 at 07-17-2155  Attending Physician:RAI,RIPUDEEP  Patient's PCP: PROVIDER NOT IN SYSTEM  Consults:  palliative medicine  Cause of Death: Acute hypercarbic respiratory failure  Secondary Diagnoses Acute hypercarbic respiratory failure Unresponsive episode Acute encephalopathy UTI Abdominal hernia with abdominal pain Fecal impaction with rectal distention Diabetes mellitus Arthritis  Brief H and P: For complete details please refer to admission H and P, but in brief Patient was a 81 year old female with diabetes, arthritis, abdominal hernia presented with worsening generalized weakness, polyuria over the last 2-3 days. Patient also reported generalized abdominal pain, shortness of breath. She also reported soft mass over the right upper quadrant. Initially denied a tenderness however noted to be grimacing on palpation. She was found to have temp of 2F, tachypnea and UA suggestive of UTI  Hospital Course: Unresponsive episode/acute encephalopathy with acute hypercarbic respiratory failure Patient was admitted with UTI and SIRS on 06/23/16. However she had rapid response on the night of 06/15/16 with an unresponsive episode, possibly may have aspirated. Other differentials could be pulmonary embolism, MI etc. Per rapid response documentation, [name]'s note and my discussion with the patient's grandson who was at the bedside, patient was alert and well until after dinner, she started having shallow respirations and not following commands. She had rapid decline after that. ABG showed pH of 7.1, PCO2 above reportable range, PO2 216, bicarbonate 47 -  Patient's grandson reported that she had never wanted any artificial ventilation/life-support or heroic measures, hence overnight code status was changed to DNR. Palliative medicine was consulted and patient was  transitioned to complete comfort care status. Patient passed on June 27, 2016 at 2157pm.   Urinary tract infection - Patient met SIRS criteria at the time of admission with temp of 2F, tachypnea RR 27 and UA suggestive of UTI - Blood cultures remained negative, urine culture showed multiple species, patient was placed on IV Rocephin.     Abdominal hernia with abdominal pain - CT abdomen and pelvis showed most of the small bowel and colon resides in the large right lateral abdominal wall hernia, smaller adjacent right anterolateral hernia contains a portion of the cecum and ileocecal valve, no obstruction perforation or acute inflammation of the bowel within the hernia. Had abdominal hernia surgery 15 years ago, per grandson, would not want another surgery at this time - Gen. surgery was consulted, recommended no indication for urgent repair. If she develops pain or desires repair, she will need to be referred to hernia specialist in tertiary setting.  Fecal impaction with rectal distention - Resolved, repeat abdominal x-ray 2/6 showed no obstruction, stool   Diabetes mellitus ([name]) -was placed on sliding-scale insulin    Arthritis  Signed:  RAI,RIPUDEEP M.D. [facility] 06/17/2016, 7:11 AM Pager: [phone]  Coding query: Sepsis was ruled out.    RAI,RIPUDEEP M.D. [facility] 07/06/2016, 5:09 PM  Pager: [phone]

## 2016-07-08 NOTE — Progress Notes (Signed)
Pt expired at 2157 hrs pronounced by 2 RNs. Pt was a DNR and was comfort care. Hospital death was expected. Condolences offered to family and time spent with grandson, as this NP knew him from last shift. Death certificate completed.  KJKG, NP [facility]

## 2016-07-08 NOTE — Significant Event (Signed)
Rapid Response Event Note During rounds RN made aware change in mental status, pt unresponsive.  Overview: Time Called: 2024 Arrival Time: 2024 Event Type: Neurologic  Initial Focused Assessment: Upon arrival pt unresponsive and would only open her eyes briefly to sternal rub, shallow respirations, no purposeful movement, not following commands, pt did not react to ABG obtained, pt with minimal gag reflexes. BP 132/63, HR 83, RR 18, 100% 1L [place], rectal temp 95.8. CBG 176. Pt placed on zoll  Kirby NP at bedside spoke with grandson at great lengths to determine pt's code status and where the family   Interventions: CXR, ABG 7.19/>120/216/47.1 Plan of Care (if not transferred): Pt made a DNR peer grandson (POA) RN aware and encouraged to call for any concerns.  Event Summary: Name of Physician Notified: Craige CottaKirby NP  at  (PTA RRT )    at    Outcome: Stayed in room and stabalized, Code status clarified  Event End Time: 2145  Gerarda FractionSHULAR, Michelle Vanhise Paige

## 2016-07-08 NOTE — Progress Notes (Signed)
Nutrition Brief Note  Chart reviewed. Pt now transitioning to comfort care.  No further nutrition interventions warranted at this time.  Please re-consult as needed.   Tephanie Escorcia A. Nashali Ditmer, RD, LDN, CDE Pager: [phone] After hours Pager: [phone]  

## 2016-07-08 NOTE — Care Management Important Message (Signed)
Important Message  Patient Details  Name: Ephriam JenkinsViolet Jallow MRN: [id number] Date of Birth: 04/25/1936   Medicare Important Message Given:  Yes    Shaquna Geigle Abena 06/30/2016, 11:44 AM

## 2016-07-08 DEATH — deceased

## 2018-06-10 IMAGING — DX DG ABDOMEN ACUTE W/ 1V CHEST
3 series · 3 of 3 positions shown · non-contrast
Comparison: Chest x-ray Sunday June, 2016.

CLINICAL DATA: Constipation issues from a hernia. Onset of
shortness of breath this morning. History of diabetes, sepsis
secondary urinary tract infection.

EXAM:
DG ABDOMEN ACUTE W/ 1V CHEST

[chest pa]
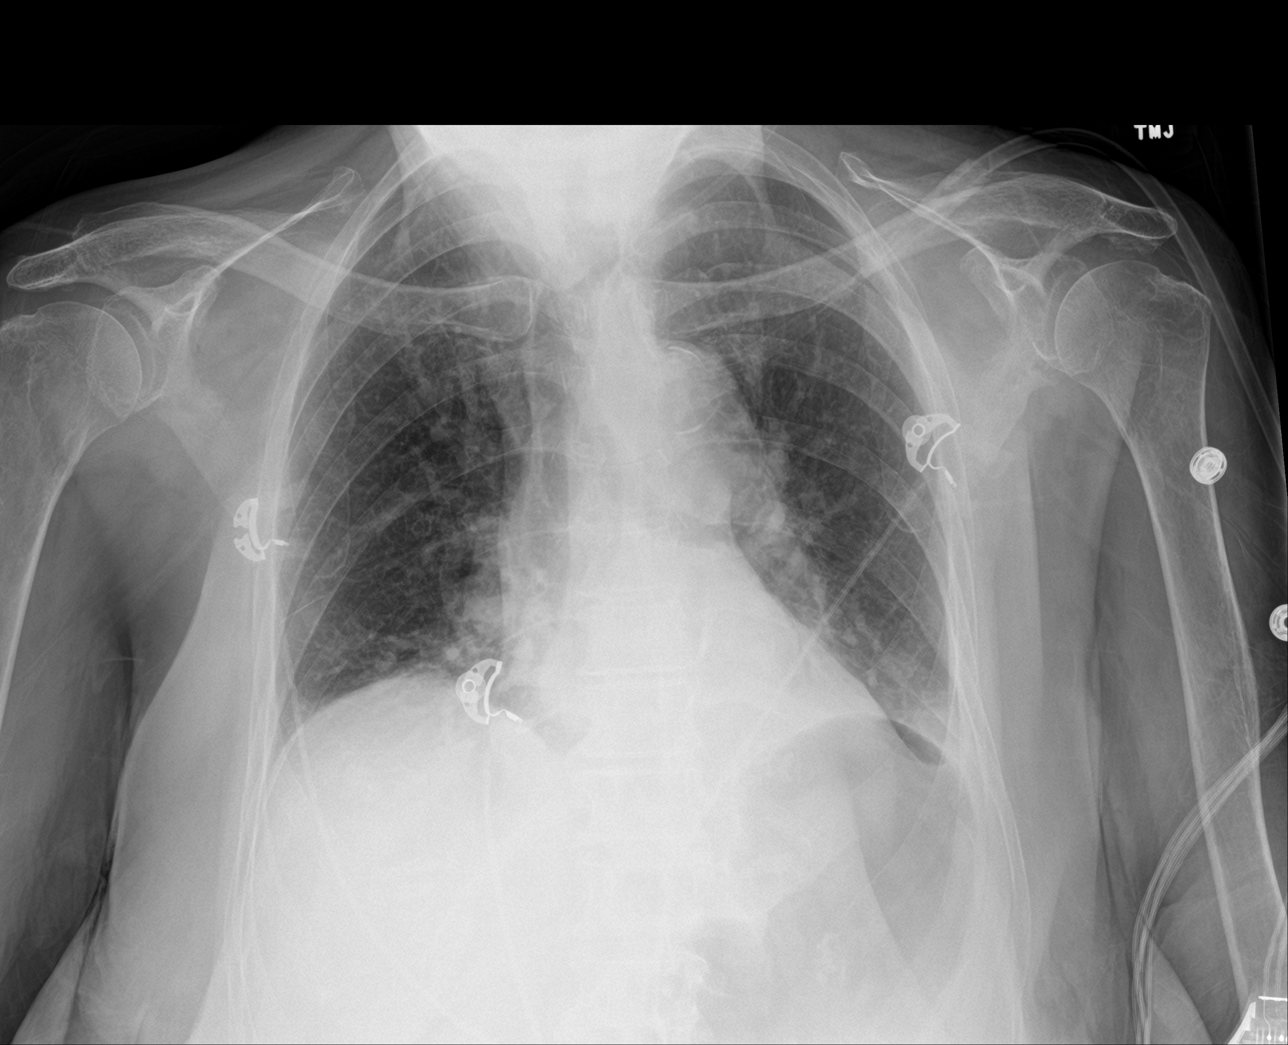

[abdomen erect]
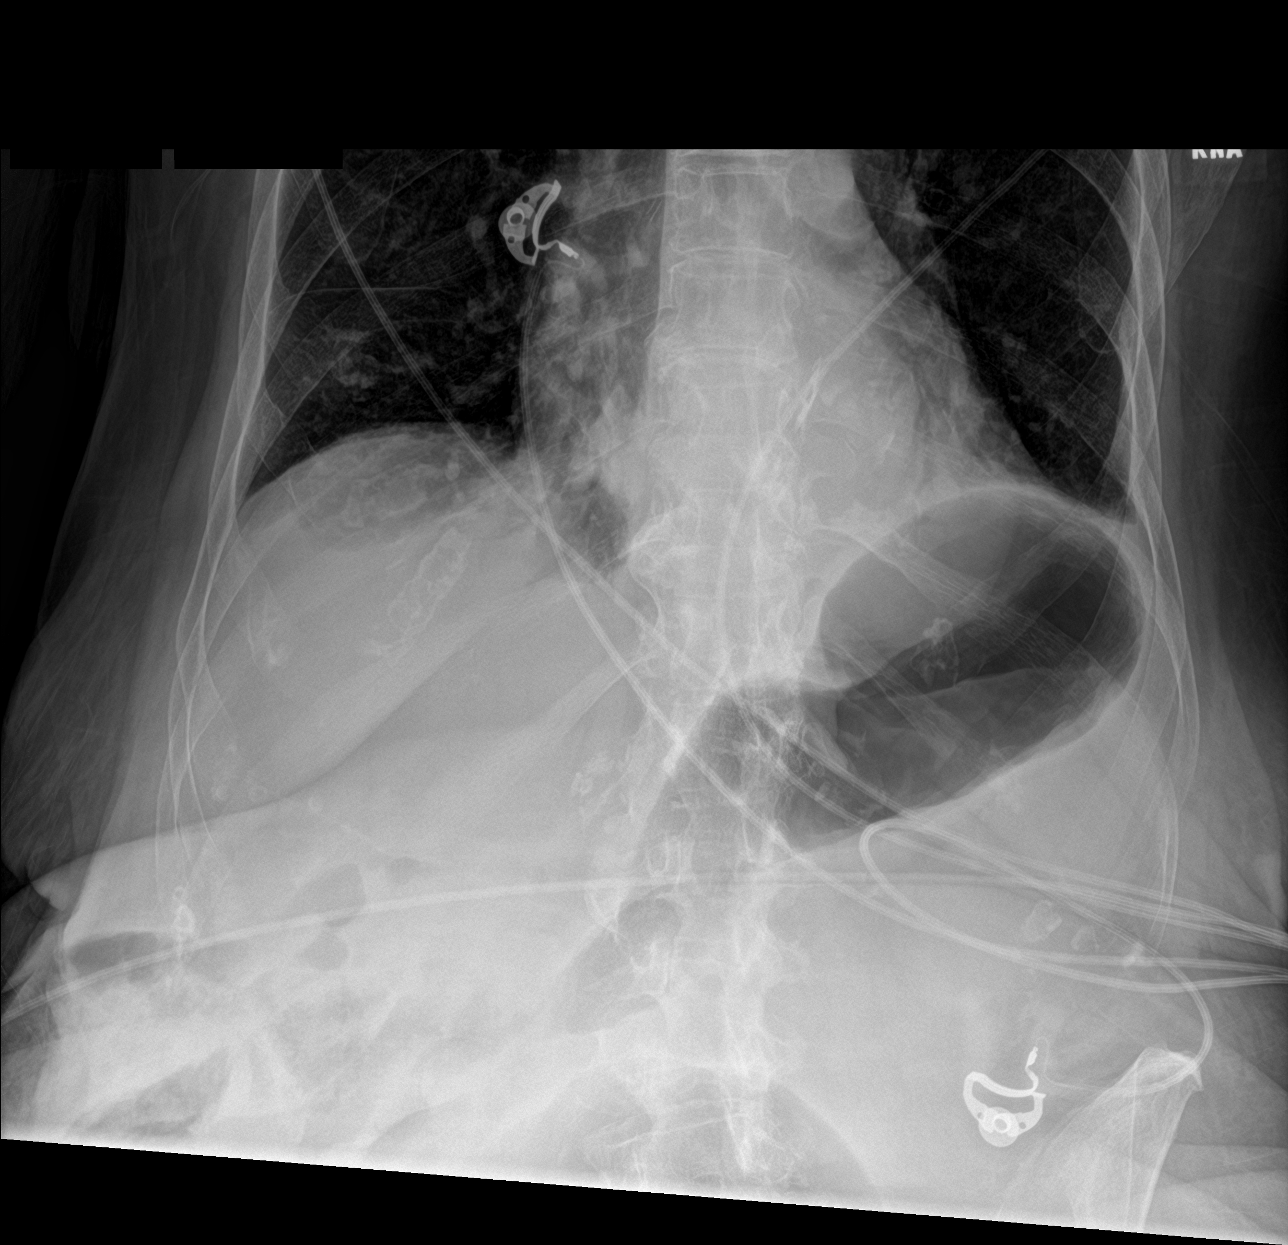

[abdomen supine]
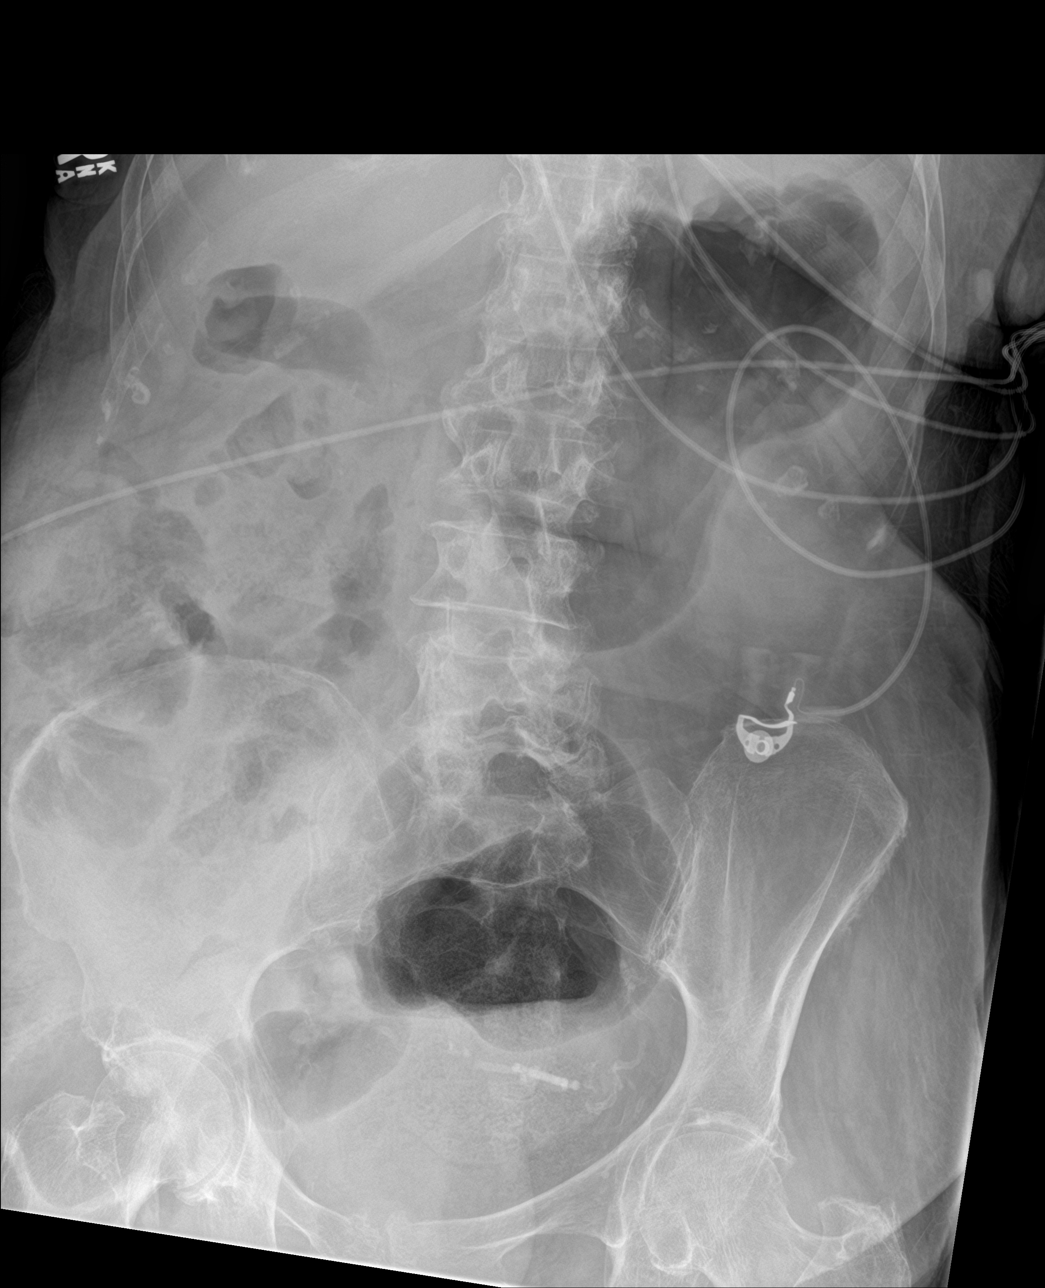

[3 of 3 positions shown; findings below may reference images not displayed]

FINDINGS: The lungs are adequately inflated. Worsening of left lower lobe
atelectasis or pneumonia has developed. There is a small left
pleural effusion. The heart is top-normal in size. The pulmonary
vascularity is normal. There is calcification in the wall of the
aortic arch. Within the abdomen there is moderate gaseous distention
of the stomach. There is stool and gas within the colon. Small
amounts of small bowel gas are present. A right abdominal hernia is
present. An IUD is present. There degenerative changes of the lower
lumbar spine.
IMPRESSION: No evidence of bowel obstruction at this time. Worsening of left
lower lobe atelectasis or pneumonia with small left pleural
effusion.

Thoracic aortic atherosclerosis.
